# Patient Record
Sex: Female | Born: 1955 | Race: White | Hispanic: No | Marital: Single | Smoking: Current every day smoker
Health system: Southern US, Community
[De-identification: ages and names within clinical notes are randomized; demographics above are authoritative.]

## PROBLEM LIST (undated history)

## (undated) DIAGNOSIS — F329 Major depressive disorder, single episode, unspecified: Secondary | ICD-10-CM

## (undated) DIAGNOSIS — M549 Dorsalgia, unspecified: Secondary | ICD-10-CM

## (undated) DIAGNOSIS — F32A Depression, unspecified: Secondary | ICD-10-CM

## (undated) DIAGNOSIS — J45909 Unspecified asthma, uncomplicated: Secondary | ICD-10-CM

## (undated) DIAGNOSIS — J189 Pneumonia, unspecified organism: Secondary | ICD-10-CM

## (undated) DIAGNOSIS — M5136 Other intervertebral disc degeneration, lumbar region: Secondary | ICD-10-CM

## (undated) DIAGNOSIS — M51369 Other intervertebral disc degeneration, lumbar region without mention of lumbar back pain or lower extremity pain: Secondary | ICD-10-CM

## (undated) DIAGNOSIS — G8929 Other chronic pain: Secondary | ICD-10-CM

## (undated) DIAGNOSIS — I10 Essential (primary) hypertension: Secondary | ICD-10-CM

## (undated) DIAGNOSIS — F419 Anxiety disorder, unspecified: Secondary | ICD-10-CM

## (undated) DIAGNOSIS — J449 Chronic obstructive pulmonary disease, unspecified: Secondary | ICD-10-CM

## (undated) HISTORY — PX: BILATERAL SALPINGECTOMY: SHX5743

## (undated) HISTORY — PX: BACK SURGERY: SHX140

## (undated) HISTORY — PX: TONSILLECTOMY: SUR1361

## (undated) HISTORY — PX: PARTIAL HYSTERECTOMY: SHX80

## (undated) HISTORY — PX: DIAGNOSTIC LAPAROSCOPY: SUR761

## (undated) HISTORY — PX: RIGHT OOPHORECTOMY: SHX2359

## (undated) HISTORY — PX: CHOLECYSTECTOMY: SHX55

---

## 1998-05-10 ENCOUNTER — Encounter: Admission: RE | Admit: 1998-05-10 | Discharge: 1998-08-08 | Payer: Self-pay | Admitting: Anesthesiology

## 1998-08-13 ENCOUNTER — Encounter: Admission: RE | Admit: 1998-08-13 | Discharge: 1998-11-04 | Payer: Self-pay | Admitting: Anesthesiology

## 1998-11-04 ENCOUNTER — Encounter: Admission: RE | Admit: 1998-11-04 | Discharge: 1999-02-02 | Payer: Self-pay | Admitting: Anesthesiology

## 1999-02-04 ENCOUNTER — Encounter: Admission: RE | Admit: 1999-02-04 | Discharge: 1999-04-21 | Payer: Self-pay | Admitting: Anesthesiology

## 1999-04-21 ENCOUNTER — Encounter: Admission: RE | Admit: 1999-04-21 | Discharge: 1999-07-12 | Payer: Self-pay | Admitting: Anesthesiology

## 1999-07-12 ENCOUNTER — Encounter: Admission: RE | Admit: 1999-07-12 | Discharge: 1999-10-10 | Payer: Self-pay | Admitting: Anesthesiology

## 1999-08-11 ENCOUNTER — Encounter: Admission: RE | Admit: 1999-08-11 | Discharge: 1999-08-11 | Payer: Self-pay | Admitting: Family Medicine

## 1999-08-11 ENCOUNTER — Encounter: Payer: Self-pay | Admitting: Family Medicine

## 1999-10-26 ENCOUNTER — Encounter: Admission: RE | Admit: 1999-10-26 | Discharge: 1999-12-23 | Payer: Self-pay | Admitting: Anesthesiology

## 2000-03-26 ENCOUNTER — Encounter: Payer: Self-pay | Admitting: Family Medicine

## 2000-03-26 ENCOUNTER — Encounter: Admission: RE | Admit: 2000-03-26 | Discharge: 2000-03-26 | Payer: Self-pay | Admitting: Family Medicine

## 2001-05-02 ENCOUNTER — Other Ambulatory Visit: Admission: RE | Admit: 2001-05-02 | Discharge: 2001-05-02 | Payer: Self-pay | Admitting: Family Medicine

## 2003-02-18 ENCOUNTER — Encounter: Payer: Self-pay | Admitting: Family Medicine

## 2003-02-18 ENCOUNTER — Encounter: Admission: RE | Admit: 2003-02-18 | Discharge: 2003-02-18 | Payer: Self-pay | Admitting: Family Medicine

## 2003-03-12 ENCOUNTER — Encounter: Admission: RE | Admit: 2003-03-12 | Discharge: 2003-03-12 | Payer: Self-pay | Admitting: Family Medicine

## 2003-03-12 ENCOUNTER — Encounter: Payer: Self-pay | Admitting: Family Medicine

## 2005-03-09 ENCOUNTER — Encounter: Admission: RE | Admit: 2005-03-09 | Discharge: 2005-03-09 | Payer: Self-pay | Admitting: Family Medicine

## 2005-12-01 ENCOUNTER — Other Ambulatory Visit: Admission: RE | Admit: 2005-12-01 | Discharge: 2005-12-01 | Payer: Self-pay | Admitting: Family Medicine

## 2006-08-15 ENCOUNTER — Encounter: Admission: RE | Admit: 2006-08-15 | Discharge: 2006-08-15 | Payer: Self-pay | Admitting: Family Medicine

## 2008-01-30 ENCOUNTER — Other Ambulatory Visit: Admission: RE | Admit: 2008-01-30 | Discharge: 2008-01-30 | Payer: Self-pay | Admitting: Family Medicine

## 2009-04-29 ENCOUNTER — Encounter: Admission: RE | Admit: 2009-04-29 | Discharge: 2009-04-29 | Payer: Self-pay | Admitting: Obstetrics and Gynecology

## 2009-09-14 ENCOUNTER — Observation Stay (HOSPITAL_COMMUNITY): Admission: AD | Admit: 2009-09-14 | Discharge: 2009-09-16 | Payer: Self-pay | Admitting: Surgery

## 2009-09-14 ENCOUNTER — Encounter (INDEPENDENT_AMBULATORY_CARE_PROVIDER_SITE_OTHER): Payer: Self-pay | Admitting: Surgery

## 2011-01-03 LAB — DIFFERENTIAL
Basophils Relative: 1 % (ref 0–1)
Eosinophils Absolute: 0.4 10*3/uL (ref 0.0–0.7)
Monocytes Absolute: 1.2 10*3/uL — ABNORMAL HIGH (ref 0.1–1.0)
Monocytes Relative: 12 % (ref 3–12)
Neutro Abs: 5 10*3/uL (ref 1.7–7.7)

## 2011-01-03 LAB — COMPREHENSIVE METABOLIC PANEL
ALT: 83 U/L — ABNORMAL HIGH (ref 0–35)
Calcium: 8.7 mg/dL (ref 8.4–10.5)
Creatinine, Ser: 0.57 mg/dL (ref 0.4–1.2)
Glucose, Bld: 101 mg/dL — ABNORMAL HIGH (ref 70–99)
Sodium: 136 mEq/L (ref 135–145)
Total Protein: 6.3 g/dL (ref 6.0–8.3)

## 2011-01-03 LAB — CBC
Hemoglobin: 14.2 g/dL (ref 12.0–15.0)
Hemoglobin: 15.4 g/dL — ABNORMAL HIGH (ref 12.0–15.0)
Hemoglobin: 16 g/dL — ABNORMAL HIGH (ref 12.0–15.0)
MCHC: 33.5 g/dL (ref 30.0–36.0)
MCHC: 33.7 g/dL (ref 30.0–36.0)
MCHC: 34 g/dL (ref 30.0–36.0)
MCV: 102.7 fL — ABNORMAL HIGH (ref 78.0–100.0)
RBC: 4.12 MIL/uL (ref 3.87–5.11)
RBC: 4.66 MIL/uL (ref 3.87–5.11)
RDW: 12.8 % (ref 11.5–15.5)
WBC: 11.5 10*3/uL — ABNORMAL HIGH (ref 4.0–10.5)

## 2011-01-03 LAB — PROTIME-INR
INR: 1.05 (ref 0.00–1.49)
Prothrombin Time: 13.6 seconds (ref 11.6–15.2)

## 2011-01-03 LAB — BASIC METABOLIC PANEL
CO2: 30 mEq/L (ref 19–32)
Chloride: 98 mEq/L (ref 96–112)
GFR calc Af Amer: >60 mL/min (ref >60)
Sodium: 135 mEq/L (ref 135–145)

## 2011-01-03 LAB — HEPATIC FUNCTION PANEL
ALT: 85 U/L — ABNORMAL HIGH (ref 0–35)
AST: 81 U/L — ABNORMAL HIGH (ref 0–37)
Albumin: 3.3 g/dL — ABNORMAL LOW (ref 3.5–5.2)
Alkaline Phosphatase: 71 U/L (ref 39–117)
Total Bilirubin: 1.4 mg/dL — ABNORMAL HIGH (ref 0.3–1.2)

## 2011-10-05 ENCOUNTER — Other Ambulatory Visit (HOSPITAL_BASED_OUTPATIENT_CLINIC_OR_DEPARTMENT_OTHER): Payer: Self-pay | Admitting: Family Medicine

## 2011-10-05 DIAGNOSIS — R141 Gas pain: Secondary | ICD-10-CM

## 2011-10-06 ENCOUNTER — Other Ambulatory Visit (HOSPITAL_BASED_OUTPATIENT_CLINIC_OR_DEPARTMENT_OTHER): Payer: Self-pay

## 2013-04-16 ENCOUNTER — Other Ambulatory Visit: Payer: Self-pay | Admitting: Family Medicine

## 2013-04-16 DIAGNOSIS — Z1231 Encounter for screening mammogram for malignant neoplasm of breast: Secondary | ICD-10-CM

## 2013-05-15 ENCOUNTER — Ambulatory Visit
Admission: RE | Admit: 2013-05-15 | Discharge: 2013-05-15 | Disposition: A | Discharge status: Home / Self Care | Payer: BC Managed Care – PPO | Source: Ambulatory Visit | Attending: Family Medicine | Admitting: Family Medicine

## 2013-05-15 DIAGNOSIS — Z1231 Encounter for screening mammogram for malignant neoplasm of breast: Secondary | ICD-10-CM

## 2015-11-05 DIAGNOSIS — B370 Candidal stomatitis: Secondary | ICD-10-CM | POA: Diagnosis not present

## 2015-11-10 DIAGNOSIS — I10 Essential (primary) hypertension: Secondary | ICD-10-CM | POA: Diagnosis not present

## 2015-11-10 DIAGNOSIS — F3341 Major depressive disorder, recurrent, in partial remission: Secondary | ICD-10-CM | POA: Diagnosis not present

## 2015-11-10 DIAGNOSIS — N951 Menopausal and female climacteric states: Secondary | ICD-10-CM | POA: Diagnosis not present

## 2015-11-10 DIAGNOSIS — M545 Low back pain: Secondary | ICD-10-CM | POA: Diagnosis not present

## 2015-11-10 DIAGNOSIS — J45909 Unspecified asthma, uncomplicated: Secondary | ICD-10-CM | POA: Diagnosis not present

## 2015-12-02 ENCOUNTER — Telehealth: Payer: Self-pay | Admitting: *Deleted

## 2015-12-02 NOTE — Telephone Encounter (Signed)
lm informing the pt to call and schedule for an new pt appt.Marland KitchenMarland KitchenTD

## 2015-12-30 ENCOUNTER — Ambulatory Visit: Payer: PPO | Admitting: Pain Medicine

## 2016-01-13 DIAGNOSIS — M5136 Other intervertebral disc degeneration, lumbar region: Secondary | ICD-10-CM | POA: Diagnosis not present

## 2016-01-13 DIAGNOSIS — M5416 Radiculopathy, lumbar region: Secondary | ICD-10-CM | POA: Diagnosis not present

## 2016-01-13 DIAGNOSIS — M545 Low back pain: Secondary | ICD-10-CM | POA: Diagnosis not present

## 2016-01-13 DIAGNOSIS — M549 Dorsalgia, unspecified: Secondary | ICD-10-CM | POA: Diagnosis not present

## 2016-02-02 DIAGNOSIS — M25551 Pain in right hip: Secondary | ICD-10-CM | POA: Diagnosis not present

## 2016-02-02 DIAGNOSIS — G8929 Other chronic pain: Secondary | ICD-10-CM | POA: Diagnosis not present

## 2016-02-02 DIAGNOSIS — M4716 Other spondylosis with myelopathy, lumbar region: Secondary | ICD-10-CM | POA: Diagnosis not present

## 2016-02-02 DIAGNOSIS — Z79899 Other long term (current) drug therapy: Secondary | ICD-10-CM | POA: Diagnosis not present

## 2016-03-02 ENCOUNTER — Other Ambulatory Visit: Payer: Self-pay | Admitting: *Deleted

## 2016-03-02 ENCOUNTER — Ambulatory Visit
Admission: RE | Admit: 2016-03-02 | Discharge: 2016-03-02 | Disposition: A | Discharge status: Home / Self Care | Payer: PPO | Source: Ambulatory Visit | Attending: *Deleted | Admitting: *Deleted

## 2016-03-02 DIAGNOSIS — M4716 Other spondylosis with myelopathy, lumbar region: Secondary | ICD-10-CM

## 2016-03-02 DIAGNOSIS — M25552 Pain in left hip: Secondary | ICD-10-CM | POA: Diagnosis not present

## 2016-03-02 DIAGNOSIS — M25551 Pain in right hip: Secondary | ICD-10-CM | POA: Diagnosis not present

## 2016-03-09 DIAGNOSIS — M4716 Other spondylosis with myelopathy, lumbar region: Secondary | ICD-10-CM | POA: Diagnosis not present

## 2016-03-09 DIAGNOSIS — G8929 Other chronic pain: Secondary | ICD-10-CM | POA: Diagnosis not present

## 2016-03-09 DIAGNOSIS — E559 Vitamin D deficiency, unspecified: Secondary | ICD-10-CM | POA: Diagnosis not present

## 2016-03-09 DIAGNOSIS — M25551 Pain in right hip: Secondary | ICD-10-CM | POA: Diagnosis not present

## 2016-03-09 DIAGNOSIS — Z79891 Long term (current) use of opiate analgesic: Secondary | ICD-10-CM | POA: Diagnosis not present

## 2016-04-13 DIAGNOSIS — Z79891 Long term (current) use of opiate analgesic: Secondary | ICD-10-CM | POA: Diagnosis not present

## 2016-04-13 DIAGNOSIS — G8929 Other chronic pain: Secondary | ICD-10-CM | POA: Diagnosis not present

## 2016-04-13 DIAGNOSIS — M4716 Other spondylosis with myelopathy, lumbar region: Secondary | ICD-10-CM | POA: Diagnosis not present

## 2016-04-20 DIAGNOSIS — G8929 Other chronic pain: Secondary | ICD-10-CM | POA: Diagnosis not present

## 2016-04-20 DIAGNOSIS — M47816 Spondylosis without myelopathy or radiculopathy, lumbar region: Secondary | ICD-10-CM | POA: Diagnosis not present

## 2016-04-20 DIAGNOSIS — Z79891 Long term (current) use of opiate analgesic: Secondary | ICD-10-CM | POA: Diagnosis not present

## 2016-05-17 DIAGNOSIS — G2581 Restless legs syndrome: Secondary | ICD-10-CM | POA: Diagnosis not present

## 2016-05-17 DIAGNOSIS — N951 Menopausal and female climacteric states: Secondary | ICD-10-CM | POA: Diagnosis not present

## 2016-05-17 DIAGNOSIS — I10 Essential (primary) hypertension: Secondary | ICD-10-CM | POA: Diagnosis not present

## 2016-05-17 DIAGNOSIS — M545 Low back pain: Secondary | ICD-10-CM | POA: Diagnosis not present

## 2016-05-17 DIAGNOSIS — F3341 Major depressive disorder, recurrent, in partial remission: Secondary | ICD-10-CM | POA: Diagnosis not present

## 2016-05-17 DIAGNOSIS — J45909 Unspecified asthma, uncomplicated: Secondary | ICD-10-CM | POA: Diagnosis not present

## 2016-05-18 DIAGNOSIS — M4716 Other spondylosis with myelopathy, lumbar region: Secondary | ICD-10-CM | POA: Diagnosis not present

## 2016-05-18 DIAGNOSIS — G8929 Other chronic pain: Secondary | ICD-10-CM | POA: Diagnosis not present

## 2016-06-15 DIAGNOSIS — M47816 Spondylosis without myelopathy or radiculopathy, lumbar region: Secondary | ICD-10-CM | POA: Diagnosis not present

## 2016-06-15 DIAGNOSIS — Z79891 Long term (current) use of opiate analgesic: Secondary | ICD-10-CM | POA: Diagnosis not present

## 2016-06-15 DIAGNOSIS — M4716 Other spondylosis with myelopathy, lumbar region: Secondary | ICD-10-CM | POA: Diagnosis not present

## 2016-06-15 DIAGNOSIS — G8929 Other chronic pain: Secondary | ICD-10-CM | POA: Diagnosis not present

## 2016-06-19 DIAGNOSIS — H43313 Vitreous membranes and strands, bilateral: Secondary | ICD-10-CM | POA: Diagnosis not present

## 2016-06-19 DIAGNOSIS — H2513 Age-related nuclear cataract, bilateral: Secondary | ICD-10-CM | POA: Diagnosis not present

## 2016-06-22 DIAGNOSIS — G8929 Other chronic pain: Secondary | ICD-10-CM | POA: Diagnosis not present

## 2016-06-22 DIAGNOSIS — M47816 Spondylosis without myelopathy or radiculopathy, lumbar region: Secondary | ICD-10-CM | POA: Diagnosis not present

## 2016-07-06 DIAGNOSIS — M47816 Spondylosis without myelopathy or radiculopathy, lumbar region: Secondary | ICD-10-CM | POA: Diagnosis not present

## 2016-07-06 DIAGNOSIS — G8929 Other chronic pain: Secondary | ICD-10-CM | POA: Diagnosis not present

## 2016-07-06 DIAGNOSIS — Z79891 Long term (current) use of opiate analgesic: Secondary | ICD-10-CM | POA: Diagnosis not present

## 2016-07-17 ENCOUNTER — Other Ambulatory Visit: Payer: Self-pay | Admitting: Family Medicine

## 2016-07-26 ENCOUNTER — Other Ambulatory Visit: Payer: Self-pay | Admitting: Family Medicine

## 2016-07-26 DIAGNOSIS — M545 Low back pain, unspecified: Secondary | ICD-10-CM

## 2016-07-26 DIAGNOSIS — G8929 Other chronic pain: Secondary | ICD-10-CM

## 2016-07-28 ENCOUNTER — Ambulatory Visit
Admission: RE | Admit: 2016-07-28 | Discharge: 2016-07-28 | Disposition: A | Discharge status: Home / Self Care | Payer: PPO | Source: Ambulatory Visit | Attending: Family Medicine | Admitting: Family Medicine

## 2016-07-28 DIAGNOSIS — M5126 Other intervertebral disc displacement, lumbar region: Secondary | ICD-10-CM | POA: Diagnosis not present

## 2016-07-28 DIAGNOSIS — G8929 Other chronic pain: Secondary | ICD-10-CM

## 2016-07-28 DIAGNOSIS — M545 Low back pain, unspecified: Secondary | ICD-10-CM

## 2016-07-28 MED ORDER — GADOBENATE DIMEGLUMINE 529 MG/ML IV SOLN
11.0000 mL | Freq: Once | INTRAVENOUS | Status: AC | PRN
  Administered 2016-07-28: 11 mL via INTRAVENOUS

## 2016-08-03 DIAGNOSIS — M545 Low back pain: Secondary | ICD-10-CM | POA: Diagnosis not present

## 2016-08-14 DIAGNOSIS — Z79891 Long term (current) use of opiate analgesic: Secondary | ICD-10-CM | POA: Diagnosis not present

## 2016-08-14 DIAGNOSIS — M47816 Spondylosis without myelopathy or radiculopathy, lumbar region: Secondary | ICD-10-CM | POA: Diagnosis not present

## 2016-08-14 DIAGNOSIS — G8929 Other chronic pain: Secondary | ICD-10-CM | POA: Diagnosis not present

## 2016-09-13 DIAGNOSIS — M549 Dorsalgia, unspecified: Secondary | ICD-10-CM | POA: Diagnosis not present

## 2016-09-13 DIAGNOSIS — I10 Essential (primary) hypertension: Secondary | ICD-10-CM | POA: Diagnosis not present

## 2016-09-13 DIAGNOSIS — M5136 Other intervertebral disc degeneration, lumbar region: Secondary | ICD-10-CM | POA: Diagnosis not present

## 2016-09-19 DIAGNOSIS — G8929 Other chronic pain: Secondary | ICD-10-CM | POA: Diagnosis not present

## 2016-09-19 DIAGNOSIS — M545 Low back pain: Secondary | ICD-10-CM | POA: Diagnosis not present

## 2016-09-21 DIAGNOSIS — Z124 Encounter for screening for malignant neoplasm of cervix: Secondary | ICD-10-CM | POA: Diagnosis not present

## 2016-09-21 DIAGNOSIS — N939 Abnormal uterine and vaginal bleeding, unspecified: Secondary | ICD-10-CM | POA: Diagnosis not present

## 2016-09-21 DIAGNOSIS — R102 Pelvic and perineal pain: Secondary | ICD-10-CM | POA: Diagnosis not present

## 2016-10-23 DIAGNOSIS — M545 Low back pain: Secondary | ICD-10-CM | POA: Diagnosis not present

## 2016-10-23 DIAGNOSIS — G8929 Other chronic pain: Secondary | ICD-10-CM | POA: Diagnosis not present

## 2016-10-23 DIAGNOSIS — Z79899 Other long term (current) drug therapy: Secondary | ICD-10-CM | POA: Diagnosis not present

## 2016-10-25 ENCOUNTER — Other Ambulatory Visit: Payer: Self-pay | Admitting: Obstetrics and Gynecology

## 2016-10-25 DIAGNOSIS — N8501 Benign endometrial hyperplasia: Secondary | ICD-10-CM | POA: Diagnosis not present

## 2016-10-25 DIAGNOSIS — N950 Postmenopausal bleeding: Secondary | ICD-10-CM | POA: Diagnosis not present

## 2016-11-14 DIAGNOSIS — N950 Postmenopausal bleeding: Secondary | ICD-10-CM | POA: Diagnosis not present

## 2016-11-21 DIAGNOSIS — M545 Low back pain: Secondary | ICD-10-CM | POA: Diagnosis not present

## 2016-11-21 DIAGNOSIS — G8929 Other chronic pain: Secondary | ICD-10-CM | POA: Diagnosis not present

## 2016-11-27 NOTE — Patient Instructions (Signed)
Your procedure is scheduled on:  Tomorrow, Feb. 28, 2018  [facility] at:  12:30PM  Pick up the phone at the desk and dial [phone].  Call this number if you have problems the morning of surgery: [phone].  Remember: Do NOT eat food:  After 6:00 AM day of surgery  Do NOT drink clear liquids after:  10:00 AM day of surgery  Take these medicines the morning of surgery with a SIP OF WATER:  Bisoprolol, Duloxetine, Use Asthma Inhalers per normal routine  Stop ALL herbal medications at this time  Do NOT smoke the day of surgery.  Do NOT wear jewelry (body piercing), metal hair clips/bobby pins, make-up, or nail polish. Do NOT wear lotions, powders, or perfumes.  You may wear deodorant. Do NOT shave for 48 hours prior to surgery. Do NOT bring valuables to the hospital. Contacts, dentures, or bridgework may not be worn into surgery.  Have a responsible adult drive you home and stay with you for 24 hours after your procedure  Bring a copy of your healthcare power of attorney and living will documents.  **Effective Friday, Jan. 12, 2018, [place] will implement no hospital visitations from children age 61 and younger due to a steady increase in flu activity in our community and hospitals. **

## 2016-11-28 ENCOUNTER — Encounter (HOSPITAL_COMMUNITY): Payer: Self-pay

## 2016-11-28 ENCOUNTER — Other Ambulatory Visit: Payer: Self-pay

## 2016-11-28 ENCOUNTER — Encounter (HOSPITAL_COMMUNITY)
Admission: RE | Admit: 2016-11-28 | Discharge: 2016-11-28 | Disposition: A | Discharge status: Home / Self Care | Payer: PPO | Source: Ambulatory Visit | Attending: Obstetrics and Gynecology | Admitting: Obstetrics and Gynecology

## 2016-11-28 DIAGNOSIS — F17210 Nicotine dependence, cigarettes, uncomplicated: Secondary | ICD-10-CM | POA: Diagnosis not present

## 2016-11-28 DIAGNOSIS — N8501 Benign endometrial hyperplasia: Secondary | ICD-10-CM | POA: Diagnosis not present

## 2016-11-28 DIAGNOSIS — Z79899 Other long term (current) drug therapy: Secondary | ICD-10-CM | POA: Diagnosis not present

## 2016-11-28 DIAGNOSIS — J449 Chronic obstructive pulmonary disease, unspecified: Secondary | ICD-10-CM | POA: Diagnosis not present

## 2016-11-28 DIAGNOSIS — F329 Major depressive disorder, single episode, unspecified: Secondary | ICD-10-CM | POA: Diagnosis not present

## 2016-11-28 DIAGNOSIS — N950 Postmenopausal bleeding: Secondary | ICD-10-CM | POA: Diagnosis not present

## 2016-11-28 DIAGNOSIS — F419 Anxiety disorder, unspecified: Secondary | ICD-10-CM | POA: Diagnosis not present

## 2016-11-28 DIAGNOSIS — Z79891 Long term (current) use of opiate analgesic: Secondary | ICD-10-CM | POA: Diagnosis not present

## 2016-11-28 DIAGNOSIS — I10 Essential (primary) hypertension: Secondary | ICD-10-CM | POA: Diagnosis not present

## 2016-11-28 DIAGNOSIS — Z7951 Long term (current) use of inhaled steroids: Secondary | ICD-10-CM | POA: Diagnosis not present

## 2016-11-28 DIAGNOSIS — Z8049 Family history of malignant neoplasm of other genital organs: Secondary | ICD-10-CM | POA: Diagnosis not present

## 2016-11-28 HISTORY — DX: Other intervertebral disc degeneration, lumbar region without mention of lumbar back pain or lower extremity pain: M51.369

## 2016-11-28 HISTORY — DX: Depression, unspecified: F32.A

## 2016-11-28 HISTORY — DX: Major depressive disorder, single episode, unspecified: F32.9

## 2016-11-28 HISTORY — DX: Other intervertebral disc degeneration, lumbar region: M51.36

## 2016-11-28 HISTORY — DX: Anxiety disorder, unspecified: F41.9

## 2016-11-28 HISTORY — DX: Essential (primary) hypertension: I10

## 2016-11-28 HISTORY — DX: Dorsalgia, unspecified: M54.9

## 2016-11-28 HISTORY — DX: Other chronic pain: G89.29

## 2016-11-28 HISTORY — DX: Unspecified asthma, uncomplicated: J45.909

## 2016-11-28 HISTORY — DX: Chronic obstructive pulmonary disease, unspecified: J44.9

## 2016-11-28 LAB — BASIC METABOLIC PANEL
ANION GAP: 10 (ref 5–15)
BUN: 11 mg/dL (ref 6–20)
CALCIUM: 9.2 mg/dL (ref 8.9–10.3)
CHLORIDE: 98 mmol/L — AB (ref 101–111)
CO2: 26 mmol/L (ref 22–32)
Creatinine, Ser: 0.6 mg/dL (ref 0.44–1.00)
GFR calc Af Amer: >60 mL/min (ref >60)
GFR calc non Af Amer: >60 mL/min (ref >60)
GLUCOSE: 95 mg/dL (ref 65–99)
POTASSIUM: 4.1 mmol/L (ref 3.5–5.1)
Sodium: 134 mmol/L — ABNORMAL LOW (ref 135–145)

## 2016-11-28 LAB — CBC
HEMATOCRIT: 43.5 % (ref 36.0–46.0)
HEMOGLOBIN: 15.7 g/dL — AB (ref 12.0–15.0)
MCH: 36.4 pg — ABNORMAL HIGH (ref 26.0–34.0)
MCHC: 36.1 g/dL — ABNORMAL HIGH (ref 30.0–36.0)
MCV: 100.9 fL — AB (ref 78.0–100.0)
Platelets: 307 10*3/uL (ref 150–400)
RBC: 4.31 MIL/uL (ref 3.87–5.11)
RDW: 12.5 % (ref 11.5–15.5)
WBC: 11.8 10*3/uL — AB (ref 4.0–10.5)

## 2016-11-29 ENCOUNTER — Ambulatory Visit (HOSPITAL_COMMUNITY): Payer: PPO | Admitting: Certified Registered Nurse Anesthetist

## 2016-11-29 ENCOUNTER — Encounter (HOSPITAL_COMMUNITY)
Admission: RE | Disposition: A | Discharge status: Home / Self Care | Payer: Self-pay | Source: Ambulatory Visit | Attending: Obstetrics and Gynecology

## 2016-11-29 ENCOUNTER — Ambulatory Visit (HOSPITAL_COMMUNITY)
Admission: RE | Admit: 2016-11-29 | Discharge: 2016-11-29 | Disposition: A | Discharge status: Home / Self Care | Payer: PPO | Source: Ambulatory Visit | Attending: Obstetrics and Gynecology | Admitting: Obstetrics and Gynecology

## 2016-11-29 ENCOUNTER — Encounter (HOSPITAL_COMMUNITY): Payer: Self-pay | Admitting: *Deleted

## 2016-11-29 ENCOUNTER — Ambulatory Visit (HOSPITAL_COMMUNITY): Payer: PPO

## 2016-11-29 DIAGNOSIS — Z79891 Long term (current) use of opiate analgesic: Secondary | ICD-10-CM | POA: Diagnosis not present

## 2016-11-29 DIAGNOSIS — Z8049 Family history of malignant neoplasm of other genital organs: Secondary | ICD-10-CM | POA: Insufficient documentation

## 2016-11-29 DIAGNOSIS — J45909 Unspecified asthma, uncomplicated: Secondary | ICD-10-CM | POA: Diagnosis not present

## 2016-11-29 DIAGNOSIS — Z7951 Long term (current) use of inhaled steroids: Secondary | ICD-10-CM | POA: Diagnosis not present

## 2016-11-29 DIAGNOSIS — N8500 Endometrial hyperplasia, unspecified: Secondary | ICD-10-CM | POA: Diagnosis not present

## 2016-11-29 DIAGNOSIS — F17210 Nicotine dependence, cigarettes, uncomplicated: Secondary | ICD-10-CM | POA: Insufficient documentation

## 2016-11-29 DIAGNOSIS — N8501 Benign endometrial hyperplasia: Secondary | ICD-10-CM | POA: Diagnosis not present

## 2016-11-29 DIAGNOSIS — N950 Postmenopausal bleeding: Secondary | ICD-10-CM | POA: Diagnosis not present

## 2016-11-29 DIAGNOSIS — I10 Essential (primary) hypertension: Secondary | ICD-10-CM | POA: Insufficient documentation

## 2016-11-29 DIAGNOSIS — F419 Anxiety disorder, unspecified: Secondary | ICD-10-CM | POA: Diagnosis not present

## 2016-11-29 DIAGNOSIS — F418 Other specified anxiety disorders: Secondary | ICD-10-CM | POA: Diagnosis not present

## 2016-11-29 DIAGNOSIS — F329 Major depressive disorder, single episode, unspecified: Secondary | ICD-10-CM | POA: Diagnosis not present

## 2016-11-29 DIAGNOSIS — Z79899 Other long term (current) drug therapy: Secondary | ICD-10-CM | POA: Diagnosis not present

## 2016-11-29 DIAGNOSIS — J449 Chronic obstructive pulmonary disease, unspecified: Secondary | ICD-10-CM | POA: Diagnosis not present

## 2016-11-29 DIAGNOSIS — R58 Hemorrhage, not elsewhere classified: Secondary | ICD-10-CM

## 2016-11-29 HISTORY — PX: DILATATION & CURETTAGE/HYSTEROSCOPY WITH MYOSURE: SHX6511

## 2016-11-29 SURGERY — DILATATION & CURETTAGE/HYSTEROSCOPY WITH MYOSURE
Anesthesia: General | Site: Uterus

## 2016-11-29 MED ORDER — PROPOFOL 10 MG/ML IV BOLUS
INTRAVENOUS | Status: AC
  Filled 2016-11-29: qty 20

## 2016-11-29 MED ORDER — LIDOCAINE HCL (CARDIAC) 20 MG/ML IV SOLN
INTRAVENOUS | Status: DC | PRN
  Administered 2016-11-29: 60 mg via INTRAVENOUS

## 2016-11-29 MED ORDER — SCOPOLAMINE 1 MG/3DAYS TD PT72
1.0000 | MEDICATED_PATCH | Freq: Once | TRANSDERMAL | Status: DC
  Administered 2016-11-29: 1.5 mg via TRANSDERMAL

## 2016-11-29 MED ORDER — PROPOFOL 10 MG/ML IV BOLUS
INTRAVENOUS | Status: DC | PRN
  Administered 2016-11-29: 120 mg via INTRAVENOUS

## 2016-11-29 MED ORDER — ONDANSETRON HCL 4 MG/2ML IJ SOLN
INTRAMUSCULAR | Status: AC
  Filled 2016-11-29: qty 2

## 2016-11-29 MED ORDER — DEXAMETHASONE SODIUM PHOSPHATE 4 MG/ML IJ SOLN
INTRAMUSCULAR | Status: AC
  Filled 2016-11-29: qty 1

## 2016-11-29 MED ORDER — PROMETHAZINE HCL 25 MG/ML IJ SOLN: 6.2500 mg | INTRAMUSCULAR | Status: DC | PRN

## 2016-11-29 MED ORDER — KETOROLAC TROMETHAMINE 30 MG/ML IJ SOLN
INTRAMUSCULAR | Status: AC
  Filled 2016-11-29: qty 1

## 2016-11-29 MED ORDER — LIDOCAINE HCL 2 % IJ SOLN
INTRAMUSCULAR | Status: AC
  Filled 2016-11-29: qty 20

## 2016-11-29 MED ORDER — MIDAZOLAM HCL 2 MG/2ML IJ SOLN
INTRAMUSCULAR | Status: AC
  Filled 2016-11-29: qty 2

## 2016-11-29 MED ORDER — LACTATED RINGERS IV SOLN
INTRAVENOUS | Status: DC
  Administered 2016-11-29 (×2): via INTRAVENOUS

## 2016-11-29 MED ORDER — FENTANYL CITRATE (PF) 100 MCG/2ML IJ SOLN
INTRAMUSCULAR | Status: DC | PRN
  Administered 2016-11-29 (×2): 50 ug via INTRAVENOUS

## 2016-11-29 MED ORDER — FENTANYL CITRATE (PF) 100 MCG/2ML IJ SOLN
INTRAMUSCULAR | Status: AC
  Filled 2016-11-29: qty 2

## 2016-11-29 MED ORDER — SODIUM CHLORIDE 0.9 % IR SOLN
Status: DC | PRN
  Administered 2016-11-29: 3000 mL

## 2016-11-29 MED ORDER — DEXAMETHASONE SODIUM PHOSPHATE 4 MG/ML IJ SOLN
INTRAMUSCULAR | Status: DC | PRN
  Administered 2016-11-29: 4 mg via INTRAVENOUS

## 2016-11-29 MED ORDER — SCOPOLAMINE 1 MG/3DAYS TD PT72
MEDICATED_PATCH | TRANSDERMAL | Status: AC
  Administered 2016-11-29: 1.5 mg via TRANSDERMAL
  Filled 2016-11-29: qty 1

## 2016-11-29 MED ORDER — MIDAZOLAM HCL 2 MG/2ML IJ SOLN
INTRAMUSCULAR | Status: DC | PRN
  Administered 2016-11-29: 2 mg via INTRAVENOUS

## 2016-11-29 MED ORDER — ONDANSETRON HCL 4 MG/2ML IJ SOLN
INTRAMUSCULAR | Status: DC | PRN
  Administered 2016-11-29: 4 mg via INTRAVENOUS

## 2016-11-29 MED ORDER — FENTANYL CITRATE (PF) 100 MCG/2ML IJ SOLN
25.0000 ug | INTRAMUSCULAR | Status: DC | PRN
  Administered 2016-11-29: 50 ug via INTRAVENOUS

## 2016-11-29 MED ORDER — LIDOCAINE HCL (CARDIAC) 20 MG/ML IV SOLN
INTRAVENOUS | Status: AC
  Filled 2016-11-29: qty 5

## 2016-11-29 MED ORDER — LIDOCAINE HCL 2 % IJ SOLN
INTRAMUSCULAR | Status: DC | PRN
  Administered 2016-11-29: 10 mL

## 2016-11-29 SURGICAL SUPPLY — 19 items
CANISTER SUCT 3000ML (MISCELLANEOUS) ×2 IMPLANT
CATH ROBINSON RED A/P 16FR (CATHETERS) ×2 IMPLANT
CLOTH BEACON ORANGE TIMEOUT ST (SAFETY) ×2 IMPLANT
CONTAINER PREFILL 10% NBF 60ML (FORM) ×4 IMPLANT
DEVICE MYOSURE LITE (MISCELLANEOUS) IMPLANT
DEVICE MYOSURE REACH (MISCELLANEOUS) IMPLANT
DILATOR CANAL MILEX (MISCELLANEOUS) ×2 IMPLANT
FILTER ARTHROSCOPY CONVERTOR (FILTER) ×2 IMPLANT
GLOVE BIO SURGEON STRL SZ7 (GLOVE) ×2 IMPLANT
GLOVE BIOGEL PI IND STRL 7.0 (GLOVE) ×2 IMPLANT
GLOVE BIOGEL PI INDICATOR 7.0 (GLOVE) ×2
GOWN STRL REUS W/TWL LRG LVL3 (GOWN DISPOSABLE) ×4 IMPLANT
PACK VAGINAL MINOR WOMEN LF (CUSTOM PROCEDURE TRAY) ×2 IMPLANT
PAD OB MATERNITY 4.3X12.25 (PERSONAL CARE ITEMS) ×2 IMPLANT
SEAL ROD LENS SCOPE MYOSURE (ABLATOR) ×2 IMPLANT
TOWEL OR 17X24 6PK STRL BLUE (TOWEL DISPOSABLE) ×4 IMPLANT
TUBING AQUILEX INFLOW (TUBING) ×2 IMPLANT
TUBING AQUILEX OUTFLOW (TUBING) ×2 IMPLANT
WATER STERILE IRR 1000ML POUR (IV SOLUTION) ×2 IMPLANT

## 2016-11-29 NOTE — Transfer of Care (Signed)
Immediate Anesthesia Transfer of Care Note  Patient: Renee Oliver  Procedure(s) Performed: Procedure(s) with comments: DILATATION & CURETTAGE/HYSTEROSCOPY (N/A) - Ultrasound Guidance  Patient Location: PACU  Anesthesia Type:General  Level of Consciousness: awake, alert , oriented and patient cooperative  Airway & Oxygen Therapy: Patient Spontanous Breathing and Patient connected to nasal cannula oxygen  Post-op Assessment: Report given to RN and Post -op Vital signs reviewed and stable  Post vital signs: Reviewed and stable 147/74, 92 NSR, 97% on 1L [place]  Last Vitals:  Vitals:   11/29/16 1243  BP: 130/70  Pulse: 74  Resp: 18  Temp: 36.5 C    Last Pain:  Vitals:   11/29/16 1243  TempSrc: Oral  PainSc: 3       Patients Stated Pain Goal: 3 (11/29/16 1243)  Complications: No apparent anesthesia complications

## 2016-11-29 NOTE — Brief Op Note (Signed)
11/29/2016  2:23 PM  PATIENT:  Reece PackerVicki L Gibbard  61 y.o. 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, Complex endometrial hyperplasia  POST-OPERATIVE DIAGNOSIS:  Same  PROCEDURE:  Procedure(s) with comments: DILATATION & CURETTAGE/HYSTEROSCOPY (N/A) - Ultrasound Guidance  SURGEON:  Surgeon(s) and Role:    * Geryl RankinsEvelyn Marigrace Mccole, MD - Primary  PHYSICIAN ASSISTANT:   ASSISTANTS: none   ANESTHESIA:   local and general  EBL:  Total I/O In: 1000 [I.V.:1000] Out: 115 [Urine:110; Blood:5]  Fluid deficit 130 ml  BLOOD ADMINISTERED:none  DRAINS: none   LOCAL MEDICATIONS USED:  2% LIDOCAINE  and Amount: 10 ml  SPECIMEN:  Source of Specimen:  Endometrial currettings, endocervical currettings  DISPOSITION OF SPECIMEN:  PATHOLOGY  COUNTS:  YES  TOURNIQUET:  * No tourniquets in log *  DICTATION: .  Dictation number [id number]  PLAN OF CARE: Discharge to home after PACU  PATIENT DISPOSITION:  PACU - hemodynamically stable.   Delay start of Pharmacological VTE agent (>24hrs) due to surgical blood loss or risk of bleeding: yes

## 2016-11-29 NOTE — Discharge Instructions (Signed)
Hysteroscopy, Care After Refer to this sheet in the next few weeks. These instructions provide you with information on caring for yourself after your procedure. Your health care provider may also give you more specific instructions. Your treatment has been planned according to current medical practices, but problems sometimes occur. Call your health care provider if you have any problems or questions after your procedure. What can I expect after the procedure? After your procedure, it is typical to have the following:  You may have some cramping. This normally lasts for a couple days.  You may have bleeding. This can vary from light spotting for a few days to menstrual-like bleeding for 3-7 days. Follow these instructions at home:  Rest for the first 1-2 days after the procedure.  Only take over-the-counter or prescription medicines as directed by your health care provider. Do not take aspirin. It can increase the chances of bleeding.  Take showers instead of baths for 2 weeks or as directed by your health care provider.  Do not drive for 24 hours or as directed.  Do not drink alcohol while taking pain medicine.  Do not use tampons, douche, or have sexual intercourse for 2 weeks or until your health care provider says it is okay.  Take your temperature twice a day for 4-5 days. Write it down each time.  Follow your health care provider's advice about diet, exercise, and lifting.  If you develop constipation, you may:  Take a mild laxative if your health care provider approves.  Add bran foods to your diet.  Drink enough fluids to keep your urine clear or pale yellow.  Try to have someone with you or available to you for the first 24-48 hours, especially if you were given a general anesthetic.  Follow up with your health care provider as directed. Contact a health care provider if:  You feel dizzy or lightheaded.  You feel sick to your stomach (nauseous).  You have abnormal  vaginal discharge.  You have a rash.  You have pain that is not controlled with medicine. Get help right away if:  You have bleeding that is heavier than a normal menstrual period.  You have a fever.  You have increasing cramps or pain, not controlled with medicine.  You have new belly (abdominal) pain.  You pass out.  You have pain in the tops of your shoulders (shoulder strap areas).  You have shortness of breath. This information is not intended to replace advice given to you by your health care provider. Make sure you discuss any questions you have with your health care provider. Document Released: 07/09/2013 Document Revised: 02/24/2016 Document Reviewed: 04/17/2013 Elsevier Interactive Patient Education  2017 [employer]   Dilation and Curettage or Vacuum Curettage, Care After This sheet gives you information about how to care for yourself after your procedure. Your health care provider may also give you more specific instructions. If you have problems or questions, contact your health care provider. What can I expect after the procedure? After your procedure, it is common to have:  Mild pain or cramping.  Some vaginal bleeding or spotting. These may last for up to 2 weeks after your procedure. Follow these instructions at home: Activity    Do not drive or use heavy machinery while taking prescription pain medicine.  Avoid driving for the first 24 hours after your procedure.  Take frequent, short walks, followed by rest periods, throughout the day. Ask your health care provider what activities are safe  for you. After 1-2 days, you may be able to return to your normal activities.  Do not lift anything heavier than 10 lb (4.5 kg) until your health care provider approves.  For at least 2 weeks, or as long as told by your health care provider, do not:  Douche.  Use tampons.  Have sexual intercourse. General instructions    Take over-the-counter and  prescription medicines only as told by your health care provider. This is especially important if you take blood thinning medicine.  Do not take baths, swim, or use a hot tub until your health care provider approves. Take showers instead of baths.  Wear compression stockings as told by your health care provider. These stockings help to prevent blood clots and reduce swelling in your legs.  It is your responsibility to get the results of your procedure. Ask your health care provider, or the department performing the procedure, when your results will be ready.  Keep all follow-up visits as told by your health care provider. This is important. Contact a health care provider if:  You have severe cramps that get worse or that do not get better with medicine.  You have severe abdominal pain.  You cannot drink fluids without vomiting.  You develop pain in a different area of your pelvis.  You have bad-smelling vaginal discharge.  You have a rash. Get help right away if:  You have vaginal bleeding that soaks more than one sanitary pad in 1 hour, for 2 hours in a row.  You pass large blood clots from your vagina.  You have a fever that is above 100.79F (38.0C).  Your abdomen feels very tender or hard.  You have chest pain.  You have shortness of breath.  You cough up blood.  You feel dizzy or light-headed.  You faint.  You have pain in your neck or shoulder area. This information is not intended to replace advice given to you by your health care provider. Make sure you discuss any questions you have with your health care provider. Document Released: 09/15/2000 Document Revised: 05/17/2016 Document Reviewed: 04/20/2016 Elsevier Interactive Patient Education  2017 [employer].

## 2016-11-29 NOTE — Anesthesia Procedure Notes (Signed)
Procedure Name: LMA Insertion Date/Time: 11/29/2016 1:49 PM Performed by: Yolonda KidaARVER, Benson Porcaro L Pre-anesthesia Checklist: Patient identified, Emergency Drugs available, Suction available and Patient being monitored Patient Re-evaluated:Patient Re-evaluated prior to inductionOxygen Delivery Method: Circle system utilized Preoxygenation: Pre-oxygenation with 100% oxygen Intubation Type: IV induction Ventilation: Mask ventilation without difficulty LMA: LMA inserted LMA Size: 4.0 Number of attempts: 1 Placement Confirmation: positive ETCO2,  CO2 detector and breath sounds checked- equal and bilateral Tube secured with: Tape Dental Injury: Teeth and Oropharynx as per pre-operative assessment

## 2016-11-29 NOTE — Anesthesia Preprocedure Evaluation (Signed)
Anesthesia Evaluation  Patient identified by MRN, date of birth, ID band Patient awake    Reviewed: Allergy & Precautions, NPO status , Patient's Chart, lab work & pertinent test results  Airway Mallampati: II  TM Distance: >3 FB Neck ROM: Full    Dental  (+) Dental Advisory Given   Pulmonary asthma , COPD,  COPD inhaler, Current Smoker,    breath sounds clear to auscultation       Cardiovascular hypertension, Pt. on medications  Rhythm:Regular Rate:Normal     Neuro/Psych Anxiety Depression negative neurological ROS     GI/Hepatic negative GI ROS, Neg liver ROS,   Endo/Other  negative endocrine ROS  Renal/GU negative Renal ROS     Musculoskeletal  (+) Arthritis ,   Abdominal   Peds  Hematology negative hematology ROS (+)   Anesthesia Other Findings   Reproductive/Obstetrics                             Lab Results  Component Value Date   WBC 11.8 (H) 11/28/2016   HGB 15.7 (H) 11/28/2016   HCT 43.5 11/28/2016   MCV 100.9 (H) 11/28/2016   PLT 307 11/28/2016    Anesthesia Physical Anesthesia Plan  ASA: II  Anesthesia Plan: General   Post-op Pain Management:    Induction: Intravenous  Airway Management Planned: LMA  Additional Equipment:   Intra-op Plan:   Post-operative Plan: Extubation in OR  Informed Consent: I have reviewed the patients History and Physical, chart, labs and discussed the procedure including the risks, benefits and alternatives for the proposed anesthesia with the patient or authorized representative who has indicated his/her understanding and acceptance.   Dental advisory given  Plan Discussed with: CRNA  Anesthesia Plan Comments:         Anesthesia Quick Evaluation

## 2016-11-29 NOTE — Anesthesia Postprocedure Evaluation (Signed)
Anesthesia Post Note  Patient: Reece PackerVicki L Resch  Procedure(s) Performed: Procedure(s) (LRB): DILATATION & CURETTAGE/HYSTEROSCOPY (N/A)  Patient location during evaluation: PACU Anesthesia Type: General Level of consciousness: awake and alert Pain management: pain level controlled Vital Signs Assessment: post-procedure vital signs reviewed and stable Respiratory status: spontaneous breathing, nonlabored ventilation, respiratory function stable and patient connected to nasal cannula oxygen Cardiovascular status: blood pressure returned to baseline and stable Postop Assessment: no signs of nausea or vomiting Anesthetic complications: no        Last Vitals:  Vitals:   11/29/16 1515 11/29/16 1530  BP: 127/77 106/74  Pulse: 81 80  Resp: 16 16  Temp:  36.7 C    Last Pain:  Vitals:   11/29/16 1530  TempSrc:   PainSc: 3    Pain Goal: Patients Stated Pain Goal: 3 (11/29/16 1530)               Kennieth RadFitzgerald, Kavina Cantave E

## 2016-11-29 NOTE — Interval H&P Note (Signed)
History and Physical Interval Note:  11/29/2016 1:14 PM  Renee Oliver  has presented today for surgery, with the diagnosis of N95.0 PMB  The various methods of treatment have been discussed with the patient and family. After consideration of risks, benefits and other options for treatment, the patient has consented to  Procedure(s) with comments: DILATATION & CURETTAGE/HYSTEROSCOPY WITH MYOSURE (N/A) - Ultrasound Guidance as a surgical intervention .  The patient's history has been reviewed, patient examined, no change in status, stable for surgery.  I have reviewed the patient's chart and labs.  Questions were answered to the patient's satisfaction.    Pt has had intermittent bleeding since last visit.  Denies chest pain, SOB, dizziness.  Dion BodyVARNADO, Arben Packman

## 2016-11-29 NOTE — H&P (Signed)
History of Present Illness  General:  61 y/o with c/o postmenopausal bleeding. Spotting since December. Sometimes it is heavier but can range from brown to red. Has had bad cramping, which seems to make bleeding worse. Has used up to 4 pantyliners/day. Denies dysparenia. Has been with same parter x 12 years. Takes English as a second language teacherstraiol and Prometrium daily. Has not missed doses. Still having 5 -6 /day and night sweats.   Current Medications  Taking   No OTC meds   OxyContin(OxyCODONE HCl ER) 30 MG Tablet ER 12 Hour Abuse-Deterrent 1 tablet Orally Twice a day   Supplement: Generic imodium   Percocet(Oxycodone-Acetaminophen) 10-325 MG Tablet Orally 3 pills a day   Cymbalta(DULoxetine HCl) 60 MG Capsule Delayed Release Particles 2 capsules Orally Once  a day   Bisoprolol-Hydrochlorothiazide 10-6.25 MG Tablet 1 tablet Orally Once a day   Symbicort(Budesonide-Formoterol Fumarate) 160-4.5 MCG/ACT Aerosol 2 puffs Inhalation Twice a day   Progesterone Micronized 100 Capsule 1 capsule Orally Once a day   Pramipexole Dihydrochloride 0.25 MG Tablet 1 tablet before bedtime Orally Once a day   Estradiol 2 MG Tablet 1 tablet Orally Once a day   Not-Taking/PRN   Gabapentin 300 MG Capsule 1 capsule before bedtime Orally Once a day   Medication List reviewed and reconciled with the patient    Past Medical History  Depression.   Hypertension.   Menopausal symptoms.   COPD/asthma - smoker.   Degenerative disc disease ([facility]).    Surgical History  lumbar laminectomy 03/90  bilateral salpingectomy and right oophorectomy 1994  tonsillectomy 1971  laparoscopy for pelvic pain    Family History  Father: deceased  Mother: deceased, hypertension, diabetes, congestive heart failure, diagnosed with Diabetes, Hypertension, CHF (congestive heart failure)  Paternal Grand Father: deceased  Paternal Grand Mother: deceased  Maternal Grand Father: deceased  Maternal Grand Mother: deceased   Brother 1: alive, diagnosed with Hypertension  Sister 1: alive, diagnosed with Hypertension  Sister 2: alive, diagnosed with Hypertension  Paternal aunt: gynecologic cancer, and one aunt with lung cancer  1 brother(s) , 2 sister(s) .   Updated 09-18-13.   Social History  General:  Tobacco use  cigarettes: Current smoker Frequency: 1/2 PPD Tobacco history last updated 10/25/2016 Alcohol: ocassionally.  Caffeine: yes, 2 servings daily, coffee.  no Recreational drug use.  Exercise: walks.  Marital Status: single, committed relationship.  Children: none.  OCCUPATION: unemployed, Disability as of 05/2013.  Seat belt use: no.    Gyn History  Sexual activity currently sexually active.  Periods : postmenopausal.  LMP bleeding x 4 weeks off and on.  Denies H/O Birth control.  Last pap smear date 09/2016, all negative.  Last mammogram date within the last two years.  Denies H/O Abnormal pap smear.  Denies H/O STD.    OB History  Never been pregnant per patient.    Allergies  Prednisone: crazy feeling: Side Effects  Amoxicillin: stomach upset: Side Effects  Aspir-81: heart race: Side Effects  NSAID: stomach upset: Side Effects  MiraLax: nausea: Side Effects  Nystatin: talk crazy "out of her head": Side Effects   Hospitalization/Major Diagnostic Procedure  Not in last year 10/2016   Physical Examination  GENERAL:  Patient appears alert and oriented.  General Appearance: well-appearing, well-developed, no acute distress.  Speech: clear.  FEMALE GENITOURINARY:  Cervix visualized, healthy appearing, no discharge, no lesions.  Vagina: pink/moist mucosa, no lesions, no abnormal discharge.  Vulva: normal, no lesions, no skin discoloration.     Assessments  1. PMB (postmenopausal bleeding) - N95.0 (Primary)   Treatment  1. PMB (postmenopausal bleeding)  PROCEDURE: GYN ENDOMETRIAL BIOPSY   Procedures  Endometrial Biopsy:  Consent Informed consent obtained. UPT  negative.  Prep cervix was prepped with betadine.  Procedure uterus was sounded to 5-6 cm. a 4 mm pipet was advanced without difficulty, with scant return of tissue, 2 passes.  Post procedure Patient tolerated procedure well.  Indication abnormal uterine bleeding.    Addendum-Ultrasound EMS 2 mm, uterine length 6 cm.  No masses seen.                    EMB complex hyperplasia.

## 2016-11-30 NOTE — Op Note (Signed)
NAMLeanora Oliver:  [name], [name]                ACCOUNT NO.:  [id number]  MEDICAL RECORD NO.:  [id number]  LOCATION:                                 FACILITY:  PHYSICIAN:  Pieter PartridgeEvelyn B Irlene Crudup, MD   DATE OF BIRTH:  12/09/1955  DATE OF PROCEDURE:  11/29/2016 DATE OF DISCHARGE:                              OPERATIVE REPORT   PREOPERATIVE DIAGNOSIS:  Postmenopausal bleeding, complex endometrial hyperplasia.  POSTOPERATIVE DIAGNOSIS:  Postmenopausal bleeding, complex endometrial hyperplasia.  PROCEDURE:  Hysteroscopy, D and C with ultrasound guidance.  SURGEON:  Pieter PartridgeEvelyn B Hermilo Dutter, MD.  ASSISTANT:  None.  ANESTHESIA:  Local and general.  BLOOD:  5.  URINE:  110 clear.  FLUID DEFICIT:  130 mL.  BLOOD ADMINISTERED:  None.  DRAINS:  None.  LOCAL:  2% lidocaine 10 mL.  SPECIMEN:  Endometrial curettings and endocervical curettings.  DISPOSITION OF SPECIMEN:  To pathology.  COUNTS:  Correct.  PATIENT DISPOSITION:  To PACU, hemodynamically stable.  COMPLICATIONS:  None.  FINDINGS:  Normal-appearing endometrial cavity.  Small uterus consistent with postmenopause.  Anteverted cervix appeared normal.  PROCEDURE IN DETAIL:  Ms. Renee Oliver was identified in the holding area. She was then taken to the operating room with IV running.  She was placed in the dorsal lithotomy position and underwent general endotracheal anesthesia without complication.  SCDs were on her legs and operating.  No IV antibiotics were administered.  A time-out was performed.  The patient was prepped and draped in normal sterile fashion.  I and O catheterization of the bladder was performed.  I attempted to place a Graves that was large, so we used a Pederson speculum.  Cervix was identified.  Anterior lip of the cervix was injected with lidocaine. Single-tooth tenaculum was then applied.  Then, a paracervical block was performed to complete 10 mL infusion.  [facility] dilators were used and we awaited for ultrasound to  arrive to make sure there was no risk of perforation.  The cervix was stenotic, especially in the internal os and she was very anteflexed.  Under direct visualization, the patient was dilated and a hysteroscope was advanced into the endometrial cavity.  The findings above were noted.  No gross abnormalities noted.  Hysteroscope was removed from the uterus.  Then, a sharp curettage of the endocervix and then the endometrium was performed.  Specimen sent separately.  There was an adequate curettage of the entire uterus; however, endometrium appeared scant.  All instruments were removed from the uterus.  The single-tooth tenaculum was removed from the anterior lip of the cervix and the tenaculum site was made hemostatic with silver nitrate.  There was no bleeding from the os.  All instruments were removed from the vagina.  Instrument and sponge counts were correct x3.  The patient was taken to the recovery room in stable condition.     Pieter PartridgeEvelyn B Mette Southgate, MD   ______________________________ Pieter PartridgeEvelyn B Naylani Bradner, MD    EBV/MEDQ  D:  11/29/2016  T:  11/29/2016  Job:  [id number]

## 2016-12-01 ENCOUNTER — Encounter (HOSPITAL_COMMUNITY): Payer: Self-pay | Admitting: Obstetrics and Gynecology

## 2016-12-07 DIAGNOSIS — I10 Essential (primary) hypertension: Secondary | ICD-10-CM | POA: Diagnosis not present

## 2016-12-07 DIAGNOSIS — F3342 Major depressive disorder, recurrent, in full remission: Secondary | ICD-10-CM | POA: Diagnosis not present

## 2016-12-20 DIAGNOSIS — Z79899 Other long term (current) drug therapy: Secondary | ICD-10-CM | POA: Diagnosis not present

## 2016-12-20 DIAGNOSIS — G8929 Other chronic pain: Secondary | ICD-10-CM | POA: Diagnosis not present

## 2016-12-20 DIAGNOSIS — M545 Low back pain: Secondary | ICD-10-CM | POA: Diagnosis not present

## 2017-01-10 IMAGING — MR MR LUMBAR SPINE WO/W CM
4 of 7 series · 16 of 48 positions shown · IV contrast (multihance)
Comparison: MRI lumbar spine without and with contrast 08/15/2006.
KUB 09/21/2011

CLINICAL DATA: Chronic low back pain without sciatica. Pain has
been getting progressively worse over time.

Creatinine was obtained on site at [HOSPITAL] at [HOSPITAL].
Results: Creatinine 0.8 mg/dL.
EXAM:
MRI LUMBAR SPINE WITHOUT AND WITH CONTRAST
TECHNIQUE: Multiplanar and multiecho pulse sequences of the lumbar spine were
obtained without and with intravenous contrast.
CONTRAST:  11mL MULTIHANCE GADOBENATE DIMEGLUMINE 529 MG/ML IV SOLN

[Series 6: T1 · sagittal · 4.0mm · 0.73mm/px · 3 of 15 slices shown (1 of 2)]
[im 1/15]
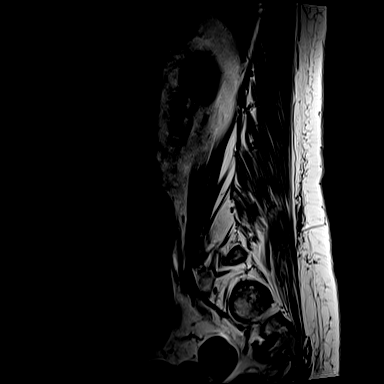
[im 10/15]
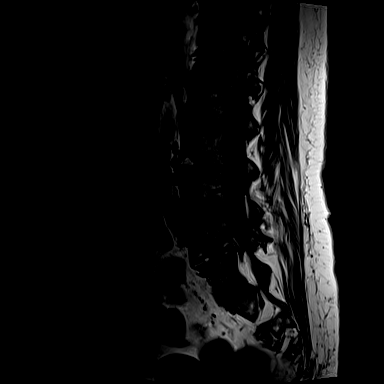
[im 15/15]
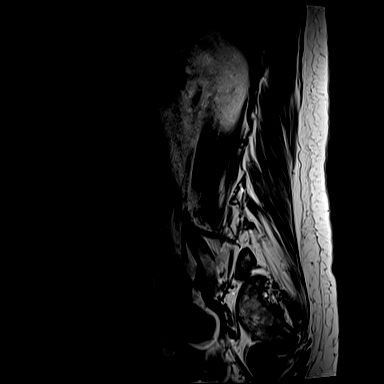

[Series 10: T1 · axial · 4.0mm · 0.28mm/px · z∈[-89,+60]mm · 3 of 33 slices shown (2 of 2)]
[im 4/33]
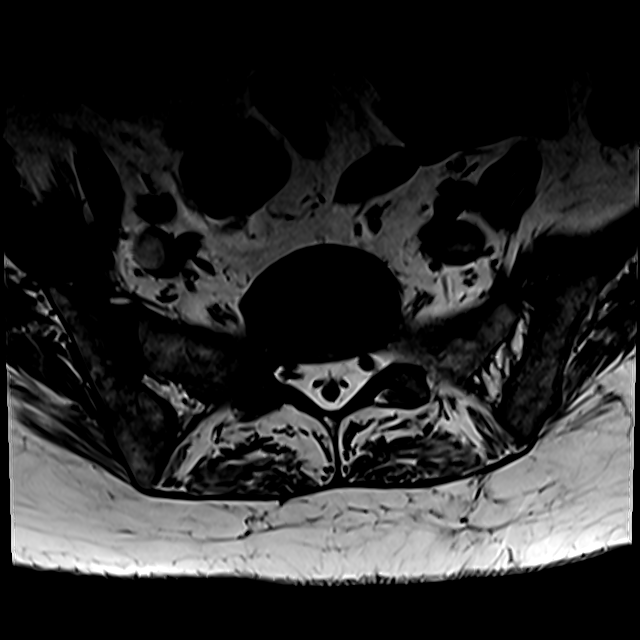
[im 18/33]
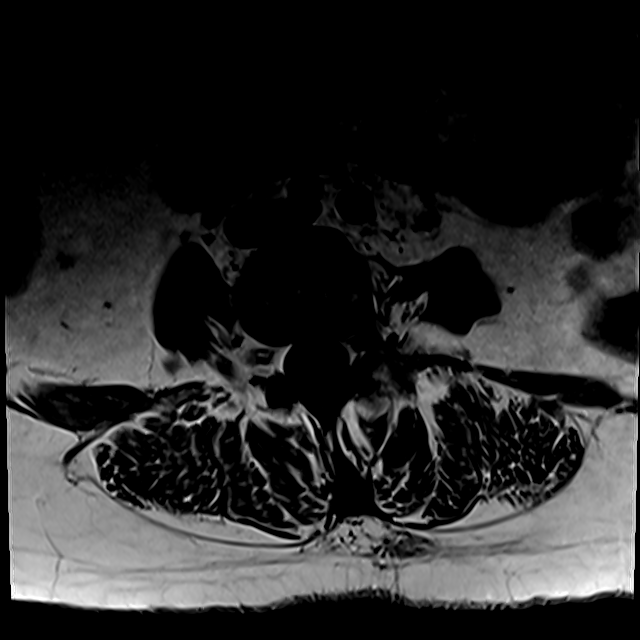
[im 29/33]
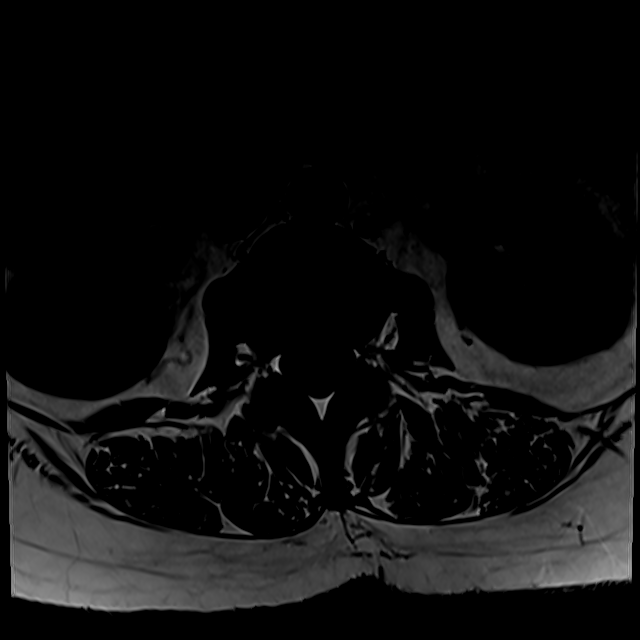

[Series 13: T2 · axial · 4.0mm · 0.28mm/px · z∈[-103,+60]mm · 7 of 33 slices shown (1 of 2)]
[im 1/33]
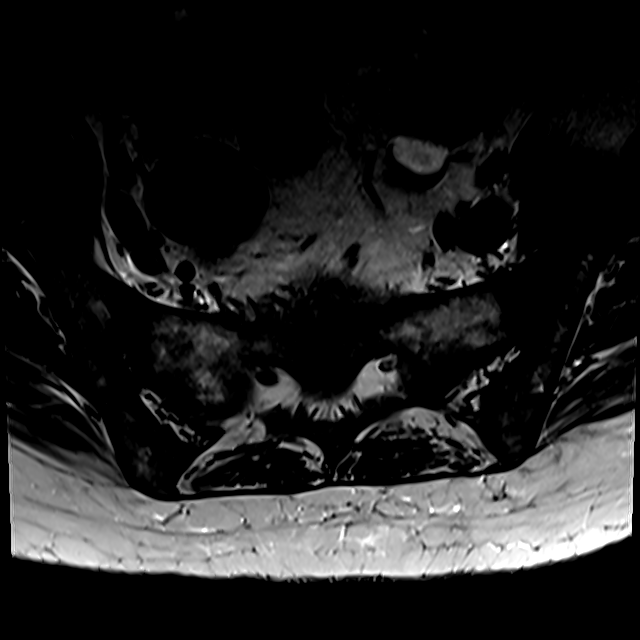
[im 4/33]
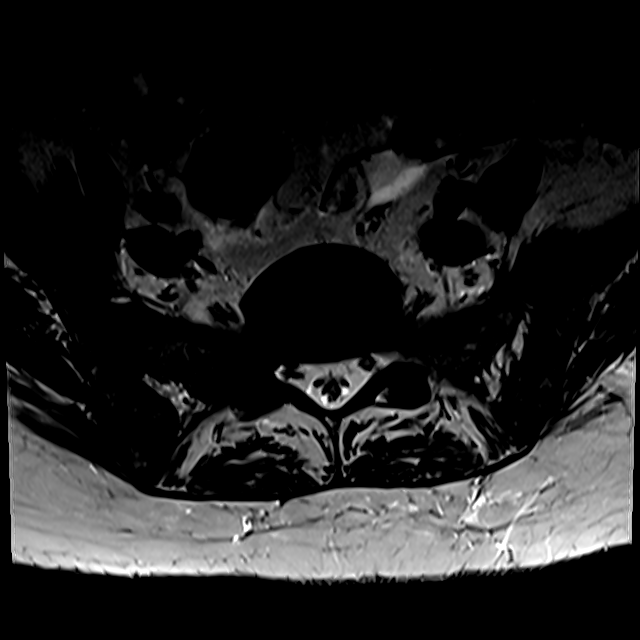
[im 11/33]
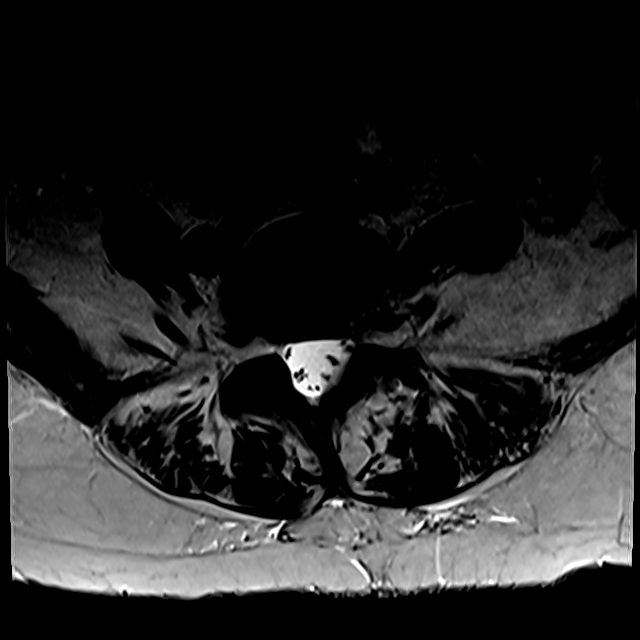
[im 15/33]
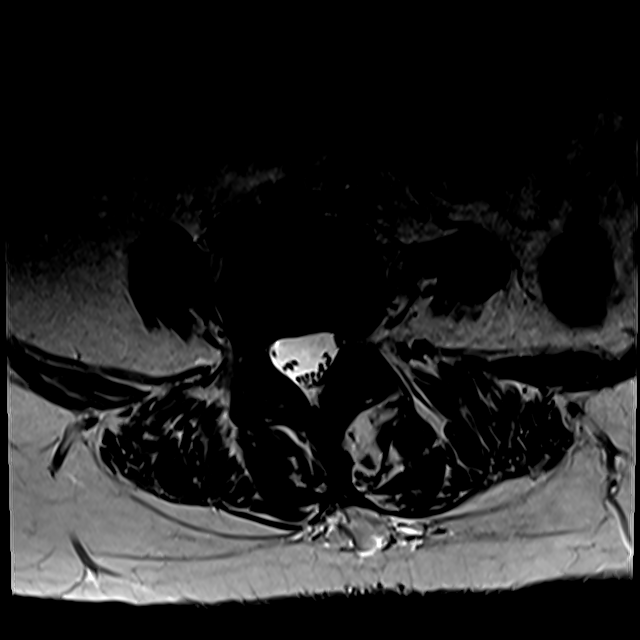
[im 18/33]
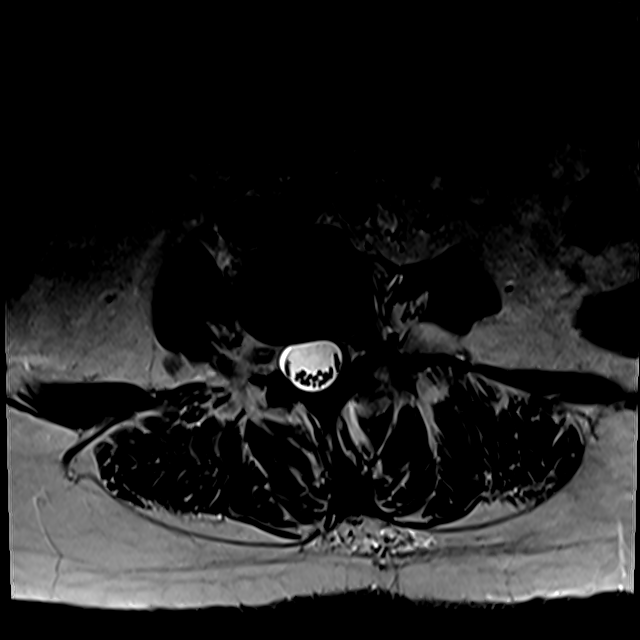
[im 22/33]
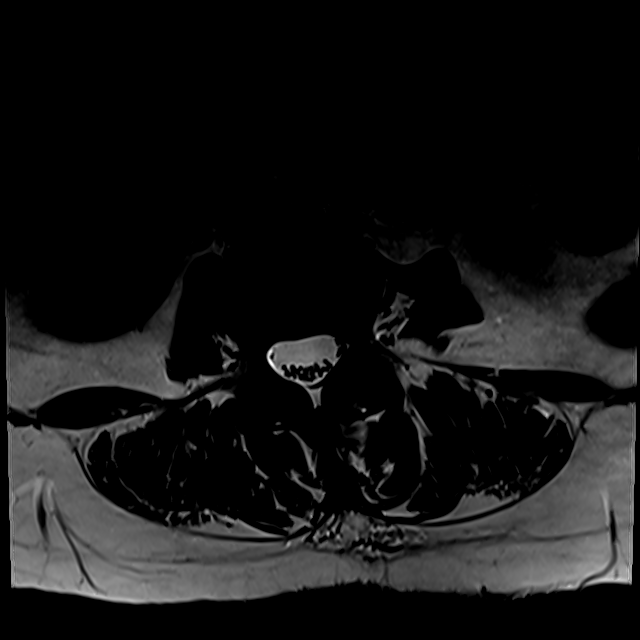
[im 29/33]
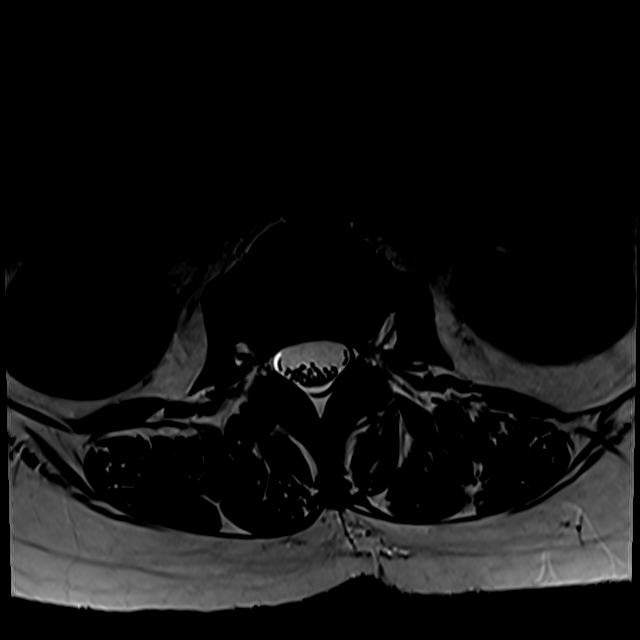

[Series 14: T2 · sagittal · 4.0mm · 0.73mm/px · 3 of 15 slices shown (2 of 2)]
[im 1/15]
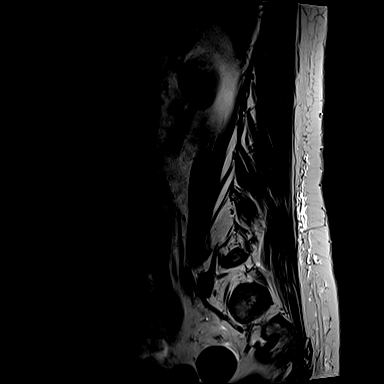
[im 8/15]
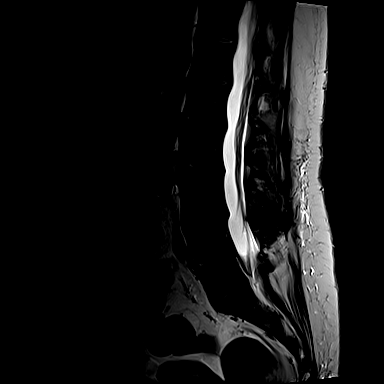
[im 15/15]
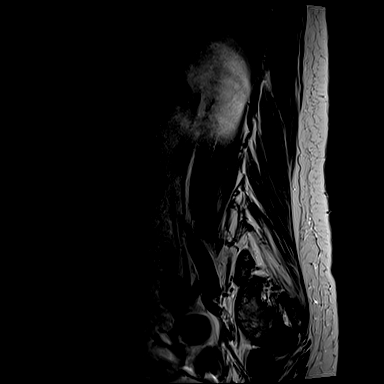

[16 of 48 positions shown; findings below may reference images not displayed]

FINDINGS: Segmentation: A transitional S1 segment is present. Five other non
rib-bearing lumbar type vertebral bodies are present. This differs
from the previous numbering system utilized.

Alignment: AP alignment is anatomic. Rightward curvature of the
lumbar spine is centered at L4.

Vertebrae: Chronic endplate marrow changes are present at L5-S1.
Marrow signal and vertebral body heights are maintained.

Conus medullaris: Extends to the L1 level and appears normal.

Paraspinal and other soft tissues: Limited imaging of the abdomen is
unremarkable. There is no significant adenopathy. There is mild
atrophy of the paraspinous musculature.

Disc levels:

L1-2:  Negative.

L2-3:  Negative.

L3-4: Mild facet hypertrophy is present without significant
stenosis.

L4-5: Mild leftward disc bulging is present. Mild left subarticular
narrowing is now present. The foramina are patent.

L5-S1: Left laminectomy is again noted. The central canal is patent.
Mild osseous narrowing on the left is stable.

S1-2:  Negative.
IMPRESSION: 1. Slight progression of leftward disc bulging at L4-5 with mild
left subarticular narrowing
2. Left laminectomy at L5-S1 without evidence for residual or
recurrent stenosis.
3. A transitional S1 segment is noted. This differs from the
numbering system used previously. This labeling scheme is confirmed
via the KUB plain film.

## 2017-01-19 DIAGNOSIS — M545 Low back pain: Secondary | ICD-10-CM | POA: Diagnosis not present

## 2017-01-19 DIAGNOSIS — Z79899 Other long term (current) drug therapy: Secondary | ICD-10-CM | POA: Diagnosis not present

## 2017-01-19 DIAGNOSIS — G8929 Other chronic pain: Secondary | ICD-10-CM | POA: Diagnosis not present

## 2017-02-10 DIAGNOSIS — Z79899 Other long term (current) drug therapy: Secondary | ICD-10-CM | POA: Diagnosis not present

## 2017-02-10 DIAGNOSIS — M545 Low back pain: Secondary | ICD-10-CM | POA: Diagnosis not present

## 2017-02-10 DIAGNOSIS — G8929 Other chronic pain: Secondary | ICD-10-CM | POA: Diagnosis not present

## 2017-03-15 DIAGNOSIS — Z79899 Other long term (current) drug therapy: Secondary | ICD-10-CM | POA: Diagnosis not present

## 2017-03-15 DIAGNOSIS — G8929 Other chronic pain: Secondary | ICD-10-CM | POA: Diagnosis not present

## 2017-03-15 DIAGNOSIS — M545 Low back pain: Secondary | ICD-10-CM | POA: Diagnosis not present

## 2017-04-13 DIAGNOSIS — G8929 Other chronic pain: Secondary | ICD-10-CM | POA: Diagnosis not present

## 2017-04-13 DIAGNOSIS — M545 Low back pain: Secondary | ICD-10-CM | POA: Diagnosis not present

## 2017-04-13 DIAGNOSIS — Z79899 Other long term (current) drug therapy: Secondary | ICD-10-CM | POA: Diagnosis not present

## 2017-05-30 DIAGNOSIS — F17200 Nicotine dependence, unspecified, uncomplicated: Secondary | ICD-10-CM | POA: Diagnosis not present

## 2017-05-30 DIAGNOSIS — J449 Chronic obstructive pulmonary disease, unspecified: Secondary | ICD-10-CM | POA: Diagnosis not present

## 2017-05-30 DIAGNOSIS — Z0000 Encounter for general adult medical examination without abnormal findings: Secondary | ICD-10-CM | POA: Diagnosis not present

## 2017-05-30 DIAGNOSIS — Z23 Encounter for immunization: Secondary | ICD-10-CM | POA: Diagnosis not present

## 2017-05-30 DIAGNOSIS — Z1211 Encounter for screening for malignant neoplasm of colon: Secondary | ICD-10-CM | POA: Diagnosis not present

## 2017-05-30 DIAGNOSIS — Z1159 Encounter for screening for other viral diseases: Secondary | ICD-10-CM | POA: Diagnosis not present

## 2017-05-30 DIAGNOSIS — Z1231 Encounter for screening mammogram for malignant neoplasm of breast: Secondary | ICD-10-CM | POA: Diagnosis not present

## 2017-05-30 DIAGNOSIS — Z13820 Encounter for screening for osteoporosis: Secondary | ICD-10-CM | POA: Diagnosis not present

## 2017-05-30 DIAGNOSIS — I10 Essential (primary) hypertension: Secondary | ICD-10-CM | POA: Diagnosis not present

## 2017-05-30 DIAGNOSIS — D692 Other nonthrombocytopenic purpura: Secondary | ICD-10-CM | POA: Diagnosis not present

## 2017-05-30 DIAGNOSIS — F3342 Major depressive disorder, recurrent, in full remission: Secondary | ICD-10-CM | POA: Diagnosis not present

## 2017-05-30 DIAGNOSIS — G2581 Restless legs syndrome: Secondary | ICD-10-CM | POA: Diagnosis not present

## 2017-06-11 ENCOUNTER — Other Ambulatory Visit: Payer: Self-pay | Admitting: Family Medicine

## 2017-06-11 DIAGNOSIS — Z1231 Encounter for screening mammogram for malignant neoplasm of breast: Secondary | ICD-10-CM

## 2017-06-13 DIAGNOSIS — Z79899 Other long term (current) drug therapy: Secondary | ICD-10-CM | POA: Diagnosis not present

## 2017-06-13 DIAGNOSIS — M545 Low back pain: Secondary | ICD-10-CM | POA: Diagnosis not present

## 2017-06-13 DIAGNOSIS — G8929 Other chronic pain: Secondary | ICD-10-CM | POA: Diagnosis not present

## 2017-07-12 DIAGNOSIS — M545 Low back pain: Secondary | ICD-10-CM | POA: Diagnosis not present

## 2017-07-12 DIAGNOSIS — Z79899 Other long term (current) drug therapy: Secondary | ICD-10-CM | POA: Diagnosis not present

## 2017-08-08 ENCOUNTER — Ambulatory Visit: Payer: PPO

## 2017-08-10 DIAGNOSIS — Z79899 Other long term (current) drug therapy: Secondary | ICD-10-CM | POA: Diagnosis not present

## 2017-08-10 DIAGNOSIS — G8929 Other chronic pain: Secondary | ICD-10-CM | POA: Diagnosis not present

## 2017-08-10 DIAGNOSIS — M545 Low back pain: Secondary | ICD-10-CM | POA: Diagnosis not present

## 2017-09-12 DIAGNOSIS — Z79899 Other long term (current) drug therapy: Secondary | ICD-10-CM | POA: Diagnosis not present

## 2017-09-12 DIAGNOSIS — G8929 Other chronic pain: Secondary | ICD-10-CM | POA: Diagnosis not present

## 2017-09-12 DIAGNOSIS — M545 Low back pain: Secondary | ICD-10-CM | POA: Diagnosis not present

## 2017-09-26 ENCOUNTER — Ambulatory Visit: Payer: PPO

## 2017-10-10 DIAGNOSIS — G8929 Other chronic pain: Secondary | ICD-10-CM | POA: Diagnosis not present

## 2017-10-10 DIAGNOSIS — M545 Low back pain: Secondary | ICD-10-CM | POA: Diagnosis not present

## 2017-10-10 DIAGNOSIS — Z79899 Other long term (current) drug therapy: Secondary | ICD-10-CM | POA: Diagnosis not present

## 2017-11-06 DIAGNOSIS — M545 Low back pain: Secondary | ICD-10-CM | POA: Diagnosis not present

## 2017-11-06 DIAGNOSIS — G8929 Other chronic pain: Secondary | ICD-10-CM | POA: Diagnosis not present

## 2017-11-06 DIAGNOSIS — Z79899 Other long term (current) drug therapy: Secondary | ICD-10-CM | POA: Diagnosis not present

## 2017-12-04 DIAGNOSIS — M47816 Spondylosis without myelopathy or radiculopathy, lumbar region: Secondary | ICD-10-CM | POA: Diagnosis not present

## 2017-12-04 DIAGNOSIS — Z79899 Other long term (current) drug therapy: Secondary | ICD-10-CM | POA: Diagnosis not present

## 2018-01-01 DIAGNOSIS — Z79899 Other long term (current) drug therapy: Secondary | ICD-10-CM | POA: Diagnosis not present

## 2018-01-01 DIAGNOSIS — G8929 Other chronic pain: Secondary | ICD-10-CM | POA: Diagnosis not present

## 2018-01-01 DIAGNOSIS — M545 Low back pain: Secondary | ICD-10-CM | POA: Diagnosis not present

## 2018-01-31 DIAGNOSIS — G8929 Other chronic pain: Secondary | ICD-10-CM | POA: Diagnosis not present

## 2018-01-31 DIAGNOSIS — Z79899 Other long term (current) drug therapy: Secondary | ICD-10-CM | POA: Diagnosis not present

## 2018-01-31 DIAGNOSIS — M545 Low back pain: Secondary | ICD-10-CM | POA: Diagnosis not present

## 2018-01-31 DIAGNOSIS — Z79891 Long term (current) use of opiate analgesic: Secondary | ICD-10-CM | POA: Diagnosis not present

## 2018-02-04 DIAGNOSIS — J449 Chronic obstructive pulmonary disease, unspecified: Secondary | ICD-10-CM | POA: Diagnosis not present

## 2018-02-04 DIAGNOSIS — N951 Menopausal and female climacteric states: Secondary | ICD-10-CM | POA: Diagnosis not present

## 2018-02-04 DIAGNOSIS — I10 Essential (primary) hypertension: Secondary | ICD-10-CM | POA: Diagnosis not present

## 2018-02-04 DIAGNOSIS — R58 Hemorrhage, not elsewhere classified: Secondary | ICD-10-CM | POA: Diagnosis not present

## 2018-02-04 DIAGNOSIS — F17210 Nicotine dependence, cigarettes, uncomplicated: Secondary | ICD-10-CM | POA: Diagnosis not present

## 2018-03-01 DIAGNOSIS — Z79891 Long term (current) use of opiate analgesic: Secondary | ICD-10-CM | POA: Diagnosis not present

## 2018-03-01 DIAGNOSIS — G8929 Other chronic pain: Secondary | ICD-10-CM | POA: Diagnosis not present

## 2018-03-01 DIAGNOSIS — M545 Low back pain: Secondary | ICD-10-CM | POA: Diagnosis not present

## 2018-03-01 DIAGNOSIS — Z79899 Other long term (current) drug therapy: Secondary | ICD-10-CM | POA: Diagnosis not present

## 2018-03-29 DIAGNOSIS — M545 Low back pain: Secondary | ICD-10-CM | POA: Diagnosis not present

## 2018-03-29 DIAGNOSIS — Z79891 Long term (current) use of opiate analgesic: Secondary | ICD-10-CM | POA: Diagnosis not present

## 2018-03-29 DIAGNOSIS — Z79899 Other long term (current) drug therapy: Secondary | ICD-10-CM | POA: Diagnosis not present

## 2018-03-29 DIAGNOSIS — G8929 Other chronic pain: Secondary | ICD-10-CM | POA: Diagnosis not present

## 2018-04-26 DIAGNOSIS — Z79899 Other long term (current) drug therapy: Secondary | ICD-10-CM | POA: Diagnosis not present

## 2018-04-26 DIAGNOSIS — Z79891 Long term (current) use of opiate analgesic: Secondary | ICD-10-CM | POA: Diagnosis not present

## 2018-04-26 DIAGNOSIS — M545 Low back pain: Secondary | ICD-10-CM | POA: Diagnosis not present

## 2018-04-26 DIAGNOSIS — G8929 Other chronic pain: Secondary | ICD-10-CM | POA: Diagnosis not present

## 2018-05-24 DIAGNOSIS — Z79891 Long term (current) use of opiate analgesic: Secondary | ICD-10-CM | POA: Diagnosis not present

## 2018-05-24 DIAGNOSIS — M545 Low back pain: Secondary | ICD-10-CM | POA: Diagnosis not present

## 2018-05-24 DIAGNOSIS — G8929 Other chronic pain: Secondary | ICD-10-CM | POA: Diagnosis not present

## 2018-05-24 DIAGNOSIS — Z79899 Other long term (current) drug therapy: Secondary | ICD-10-CM | POA: Diagnosis not present

## 2018-06-20 DIAGNOSIS — G8929 Other chronic pain: Secondary | ICD-10-CM | POA: Diagnosis not present

## 2018-06-20 DIAGNOSIS — Z79899 Other long term (current) drug therapy: Secondary | ICD-10-CM | POA: Diagnosis not present

## 2018-06-20 DIAGNOSIS — M545 Low back pain: Secondary | ICD-10-CM | POA: Diagnosis not present

## 2018-06-20 DIAGNOSIS — Z79891 Long term (current) use of opiate analgesic: Secondary | ICD-10-CM | POA: Diagnosis not present

## 2018-07-19 DIAGNOSIS — Z79891 Long term (current) use of opiate analgesic: Secondary | ICD-10-CM | POA: Diagnosis not present

## 2018-07-19 DIAGNOSIS — Z79899 Other long term (current) drug therapy: Secondary | ICD-10-CM | POA: Diagnosis not present

## 2018-07-19 DIAGNOSIS — G8929 Other chronic pain: Secondary | ICD-10-CM | POA: Diagnosis not present

## 2018-07-19 DIAGNOSIS — M545 Low back pain: Secondary | ICD-10-CM | POA: Diagnosis not present

## 2018-08-08 DIAGNOSIS — R808 Other proteinuria: Secondary | ICD-10-CM | POA: Diagnosis not present

## 2018-08-08 DIAGNOSIS — Z23 Encounter for immunization: Secondary | ICD-10-CM | POA: Diagnosis not present

## 2018-08-08 DIAGNOSIS — J449 Chronic obstructive pulmonary disease, unspecified: Secondary | ICD-10-CM | POA: Diagnosis not present

## 2018-08-08 DIAGNOSIS — I10 Essential (primary) hypertension: Secondary | ICD-10-CM | POA: Diagnosis not present

## 2018-08-08 DIAGNOSIS — E782 Mixed hyperlipidemia: Secondary | ICD-10-CM | POA: Diagnosis not present

## 2018-08-08 DIAGNOSIS — F17200 Nicotine dependence, unspecified, uncomplicated: Secondary | ICD-10-CM | POA: Diagnosis not present

## 2018-08-08 DIAGNOSIS — Z0000 Encounter for general adult medical examination without abnormal findings: Secondary | ICD-10-CM | POA: Diagnosis not present

## 2018-08-08 DIAGNOSIS — Z1231 Encounter for screening mammogram for malignant neoplasm of breast: Secondary | ICD-10-CM | POA: Diagnosis not present

## 2018-08-13 ENCOUNTER — Other Ambulatory Visit: Payer: Self-pay | Admitting: Family Medicine

## 2018-08-13 DIAGNOSIS — Z1231 Encounter for screening mammogram for malignant neoplasm of breast: Secondary | ICD-10-CM

## 2018-08-20 DIAGNOSIS — Z6821 Body mass index (BMI) 21.0-21.9, adult: Secondary | ICD-10-CM | POA: Diagnosis not present

## 2018-08-20 DIAGNOSIS — M545 Low back pain: Secondary | ICD-10-CM | POA: Diagnosis not present

## 2018-08-20 DIAGNOSIS — Z79899 Other long term (current) drug therapy: Secondary | ICD-10-CM | POA: Diagnosis not present

## 2018-08-20 DIAGNOSIS — G8929 Other chronic pain: Secondary | ICD-10-CM | POA: Diagnosis not present

## 2018-08-20 DIAGNOSIS — Z79891 Long term (current) use of opiate analgesic: Secondary | ICD-10-CM | POA: Diagnosis not present

## 2018-09-18 DIAGNOSIS — G8929 Other chronic pain: Secondary | ICD-10-CM | POA: Diagnosis not present

## 2018-09-18 DIAGNOSIS — Z79891 Long term (current) use of opiate analgesic: Secondary | ICD-10-CM | POA: Diagnosis not present

## 2018-09-18 DIAGNOSIS — M545 Low back pain: Secondary | ICD-10-CM | POA: Diagnosis not present

## 2018-09-18 DIAGNOSIS — Z79899 Other long term (current) drug therapy: Secondary | ICD-10-CM | POA: Diagnosis not present

## 2018-09-18 DIAGNOSIS — F1120 Opioid dependence, uncomplicated: Secondary | ICD-10-CM | POA: Diagnosis not present

## 2018-10-16 DIAGNOSIS — Z79891 Long term (current) use of opiate analgesic: Secondary | ICD-10-CM | POA: Diagnosis not present

## 2018-10-16 DIAGNOSIS — Z79899 Other long term (current) drug therapy: Secondary | ICD-10-CM | POA: Diagnosis not present

## 2018-10-16 DIAGNOSIS — M545 Low back pain: Secondary | ICD-10-CM | POA: Diagnosis not present

## 2018-10-16 DIAGNOSIS — Z6821 Body mass index (BMI) 21.0-21.9, adult: Secondary | ICD-10-CM | POA: Diagnosis not present

## 2018-10-16 DIAGNOSIS — G8929 Other chronic pain: Secondary | ICD-10-CM | POA: Diagnosis not present

## 2018-11-13 DIAGNOSIS — G8929 Other chronic pain: Secondary | ICD-10-CM | POA: Diagnosis not present

## 2018-11-13 DIAGNOSIS — M545 Low back pain: Secondary | ICD-10-CM | POA: Diagnosis not present

## 2018-11-13 DIAGNOSIS — Z79899 Other long term (current) drug therapy: Secondary | ICD-10-CM | POA: Diagnosis not present

## 2018-11-13 DIAGNOSIS — Z79891 Long term (current) use of opiate analgesic: Secondary | ICD-10-CM | POA: Diagnosis not present

## 2018-11-13 DIAGNOSIS — Z6821 Body mass index (BMI) 21.0-21.9, adult: Secondary | ICD-10-CM | POA: Diagnosis not present

## 2018-11-14 DIAGNOSIS — Z79899 Other long term (current) drug therapy: Secondary | ICD-10-CM | POA: Diagnosis not present

## 2018-12-11 DIAGNOSIS — G8929 Other chronic pain: Secondary | ICD-10-CM | POA: Diagnosis not present

## 2018-12-11 DIAGNOSIS — Z79899 Other long term (current) drug therapy: Secondary | ICD-10-CM | POA: Diagnosis not present

## 2018-12-11 DIAGNOSIS — Z6821 Body mass index (BMI) 21.0-21.9, adult: Secondary | ICD-10-CM | POA: Diagnosis not present

## 2018-12-11 DIAGNOSIS — M545 Low back pain: Secondary | ICD-10-CM | POA: Diagnosis not present

## 2018-12-11 DIAGNOSIS — Z79891 Long term (current) use of opiate analgesic: Secondary | ICD-10-CM | POA: Diagnosis not present

## 2018-12-31 DIAGNOSIS — N950 Postmenopausal bleeding: Secondary | ICD-10-CM | POA: Diagnosis not present

## 2018-12-31 DIAGNOSIS — Z720 Tobacco use: Secondary | ICD-10-CM | POA: Diagnosis not present

## 2019-01-08 DIAGNOSIS — G8929 Other chronic pain: Secondary | ICD-10-CM | POA: Diagnosis not present

## 2019-01-08 DIAGNOSIS — Z79891 Long term (current) use of opiate analgesic: Secondary | ICD-10-CM | POA: Diagnosis not present

## 2019-01-08 DIAGNOSIS — Z79899 Other long term (current) drug therapy: Secondary | ICD-10-CM | POA: Diagnosis not present

## 2019-01-08 DIAGNOSIS — M545 Low back pain: Secondary | ICD-10-CM | POA: Diagnosis not present

## 2019-01-08 DIAGNOSIS — F17210 Nicotine dependence, cigarettes, uncomplicated: Secondary | ICD-10-CM | POA: Diagnosis not present

## 2019-01-08 DIAGNOSIS — F1120 Opioid dependence, uncomplicated: Secondary | ICD-10-CM | POA: Diagnosis not present

## 2019-01-30 DIAGNOSIS — B370 Candidal stomatitis: Secondary | ICD-10-CM | POA: Diagnosis not present

## 2019-01-30 DIAGNOSIS — Z1239 Encounter for other screening for malignant neoplasm of breast: Secondary | ICD-10-CM | POA: Diagnosis not present

## 2019-02-06 DIAGNOSIS — Z79899 Other long term (current) drug therapy: Secondary | ICD-10-CM | POA: Diagnosis not present

## 2019-02-06 DIAGNOSIS — F17210 Nicotine dependence, cigarettes, uncomplicated: Secondary | ICD-10-CM | POA: Diagnosis not present

## 2019-02-06 DIAGNOSIS — M545 Low back pain: Secondary | ICD-10-CM | POA: Diagnosis not present

## 2019-02-06 DIAGNOSIS — Z79891 Long term (current) use of opiate analgesic: Secondary | ICD-10-CM | POA: Diagnosis not present

## 2019-02-06 DIAGNOSIS — G8929 Other chronic pain: Secondary | ICD-10-CM | POA: Diagnosis not present

## 2019-03-04 DIAGNOSIS — G8929 Other chronic pain: Secondary | ICD-10-CM | POA: Diagnosis not present

## 2019-03-04 DIAGNOSIS — I10 Essential (primary) hypertension: Secondary | ICD-10-CM | POA: Diagnosis not present

## 2019-03-04 DIAGNOSIS — M545 Low back pain: Secondary | ICD-10-CM | POA: Diagnosis not present

## 2019-03-04 DIAGNOSIS — Z79899 Other long term (current) drug therapy: Secondary | ICD-10-CM | POA: Diagnosis not present

## 2019-03-11 DIAGNOSIS — Z1239 Encounter for other screening for malignant neoplasm of breast: Secondary | ICD-10-CM | POA: Diagnosis not present

## 2019-03-11 DIAGNOSIS — G894 Chronic pain syndrome: Secondary | ICD-10-CM | POA: Diagnosis not present

## 2019-03-11 DIAGNOSIS — I10 Essential (primary) hypertension: Secondary | ICD-10-CM | POA: Diagnosis not present

## 2019-03-11 DIAGNOSIS — F17210 Nicotine dependence, cigarettes, uncomplicated: Secondary | ICD-10-CM | POA: Diagnosis not present

## 2019-03-12 ENCOUNTER — Other Ambulatory Visit: Payer: Self-pay | Admitting: Family Medicine

## 2019-03-12 DIAGNOSIS — Z1231 Encounter for screening mammogram for malignant neoplasm of breast: Secondary | ICD-10-CM

## 2019-04-01 DIAGNOSIS — M47816 Spondylosis without myelopathy or radiculopathy, lumbar region: Secondary | ICD-10-CM | POA: Diagnosis not present

## 2019-04-01 DIAGNOSIS — Z79899 Other long term (current) drug therapy: Secondary | ICD-10-CM | POA: Diagnosis not present

## 2019-04-02 DIAGNOSIS — I10 Essential (primary) hypertension: Secondary | ICD-10-CM | POA: Diagnosis not present

## 2019-04-15 DIAGNOSIS — H2513 Age-related nuclear cataract, bilateral: Secondary | ICD-10-CM | POA: Diagnosis not present

## 2019-04-16 DIAGNOSIS — I10 Essential (primary) hypertension: Secondary | ICD-10-CM | POA: Diagnosis not present

## 2019-04-16 DIAGNOSIS — Z1329 Encounter for screening for other suspected endocrine disorder: Secondary | ICD-10-CM | POA: Diagnosis not present

## 2019-05-02 ENCOUNTER — Ambulatory Visit: Payer: PPO

## 2019-05-05 DIAGNOSIS — Z79899 Other long term (current) drug therapy: Secondary | ICD-10-CM | POA: Diagnosis not present

## 2019-05-05 DIAGNOSIS — M129 Arthropathy, unspecified: Secondary | ICD-10-CM | POA: Diagnosis not present

## 2019-05-05 DIAGNOSIS — M961 Postlaminectomy syndrome, not elsewhere classified: Secondary | ICD-10-CM | POA: Diagnosis not present

## 2019-05-05 DIAGNOSIS — G894 Chronic pain syndrome: Secondary | ICD-10-CM | POA: Diagnosis not present

## 2019-05-05 DIAGNOSIS — E559 Vitamin D deficiency, unspecified: Secondary | ICD-10-CM | POA: Diagnosis not present

## 2019-06-04 DIAGNOSIS — R002 Palpitations: Secondary | ICD-10-CM | POA: Diagnosis not present

## 2019-06-04 DIAGNOSIS — I10 Essential (primary) hypertension: Secondary | ICD-10-CM | POA: Diagnosis not present

## 2019-06-05 DIAGNOSIS — E559 Vitamin D deficiency, unspecified: Secondary | ICD-10-CM | POA: Diagnosis not present

## 2019-06-05 DIAGNOSIS — Z79899 Other long term (current) drug therapy: Secondary | ICD-10-CM | POA: Diagnosis not present

## 2019-06-05 DIAGNOSIS — M47816 Spondylosis without myelopathy or radiculopathy, lumbar region: Secondary | ICD-10-CM | POA: Diagnosis not present

## 2019-06-05 DIAGNOSIS — G894 Chronic pain syndrome: Secondary | ICD-10-CM | POA: Diagnosis not present

## 2019-06-24 ENCOUNTER — Other Ambulatory Visit: Payer: Self-pay | Admitting: Obstetrics and Gynecology

## 2019-06-24 DIAGNOSIS — N8501 Benign endometrial hyperplasia: Secondary | ICD-10-CM | POA: Diagnosis not present

## 2019-06-24 DIAGNOSIS — N950 Postmenopausal bleeding: Secondary | ICD-10-CM | POA: Diagnosis not present

## 2019-07-03 ENCOUNTER — Telehealth: Payer: Self-pay | Admitting: *Deleted

## 2019-07-03 ENCOUNTER — Other Ambulatory Visit: Payer: Self-pay | Admitting: *Deleted

## 2019-07-03 DIAGNOSIS — R002 Palpitations: Secondary | ICD-10-CM

## 2019-07-03 DIAGNOSIS — R000 Tachycardia, unspecified: Secondary | ICD-10-CM

## 2019-07-03 NOTE — Telephone Encounter (Signed)
Deleted wrong cell phone number from our records. Please call [facility] at [phone] to set up your ZIO patch cardiac holter monitor as requested by Dr. Curly Rim.

## 2019-07-04 DIAGNOSIS — Z23 Encounter for immunization: Secondary | ICD-10-CM | POA: Diagnosis not present

## 2019-07-04 DIAGNOSIS — Z79899 Other long term (current) drug therapy: Secondary | ICD-10-CM | POA: Diagnosis not present

## 2019-07-04 DIAGNOSIS — G894 Chronic pain syndrome: Secondary | ICD-10-CM | POA: Diagnosis not present

## 2019-07-04 DIAGNOSIS — M961 Postlaminectomy syndrome, not elsewhere classified: Secondary | ICD-10-CM | POA: Diagnosis not present

## 2019-07-07 ENCOUNTER — Ambulatory Visit: Payer: PPO

## 2019-07-09 ENCOUNTER — Other Ambulatory Visit: Payer: Self-pay

## 2019-07-09 ENCOUNTER — Encounter: Payer: Self-pay | Admitting: Cardiology

## 2019-07-09 ENCOUNTER — Ambulatory Visit: Payer: PPO | Admitting: Cardiology

## 2019-07-09 VITALS — BP 155/97 | HR 117 | Ht 63.0 in | Wt 123.8 lb

## 2019-07-09 DIAGNOSIS — Z716 Tobacco abuse counseling: Secondary | ICD-10-CM | POA: Diagnosis not present

## 2019-07-09 DIAGNOSIS — Z720 Tobacco use: Secondary | ICD-10-CM | POA: Diagnosis not present

## 2019-07-09 DIAGNOSIS — R002 Palpitations: Secondary | ICD-10-CM | POA: Diagnosis not present

## 2019-07-09 DIAGNOSIS — Z7189 Other specified counseling: Secondary | ICD-10-CM | POA: Diagnosis not present

## 2019-07-09 NOTE — Patient Instructions (Addendum)
Medication Instructions:  Your Physician recommend you continue on your current medication as directed.    If you need a refill on your cardiac medications before your next appointment, please call your pharmacy.   Lab work: None  Testing/Procedures: Your physician has requested that you have an echocardiogram. Echocardiography is a painless test that uses sound waves to create images of your heart. It provides your doctor with information about the size and shape of your heart and how well your heart's chambers and valves are working. This procedure takes approximately one hour. There are no restrictions for this procedure. [address]   Follow-Up: At [facility], you and your health needs are our priority.  As part of our continuing mission to provide you with exceptional heart care, we have created designated Provider Care Teams.  These Care Teams include your primary Cardiologist (physician) and Advanced Practice Providers (APPs -  Physician Assistants and Nurse Practitioners) who all work together to provide you with the care you need, when you need it. You will need a follow up appointment in 6 weeks.  Please call our office 2 months in advance to schedule this appointment.  You may see Dr. Harrell Gave or one of the following Advanced Practice Providers on your designated Care Team:   Rosaria Ferries, PA-C . Jory Sims, DNP, ANP

## 2019-07-09 NOTE — Progress Notes (Signed)
Cardiology Office Note:    Date:  07/09/2019   ID:  Renee Oliver, DOB May 07, 1956, MRN [id number]  PCP:  Joycelyn RuaMeyers, Stephen, MD  Cardiologist:  Jodelle RedBridgette Mardell Suttles, MD  Referring MD: Joycelyn RuaMeyers, Stephen, MD   CC: new patient consult for palpitations  History of Present Illness:    Renee Oliver is a 63 y.o. female with a hx of HTN who is seen as a new consult at the request of Joycelyn RuaMeyers, Stephen, MD for the evaluation and management of palpitations.  Records scanned under media tab, reviewed today. Reports heart racing with certain medications, up to 120 bpm, without associated symptoms.   Patient's other concerns today: goes to a pain management doctor. Was taken off oxycontin 30 mg medication, trying to find another medication that is long acting. Asking for a copy of ECG to be sent to Merryl HackerJanelle Grossman, [facility]C at [facility] as well as any comments on buprenorphine. Fax [phone].  Tachycardia/palpitations: -Initial onset: first noted end of June or July 2020, was having an elevated BP and elevated HR as well. Was on bisoprolol before, HR in 70s. More recently HR has been 90s-120s. 04/16/19 visit had bisoprolol stopped, started on losartan (which has been increased from 25 mg to 100 mg) along with HCTZ. Did not wean bisoprolol, was on 10 mg daily. Had an episode of tachycardia up to 130 bpm with a generic inhaler, was told not use this anymore. Breathing has gotten worse since stopping the bisoprolol. -Frequency/Duration: constant. -Associated symptoms: no chest pain, lightheadedness, nausea. Sometimes headaches and shortness of breath -Aggravating/alleviating factors:better if she takes deep breaths and relaxes. -Syncope/near syncope: none -Prior cardiac history: none -Prior ECG:  -Prior workup: none, pending heart monitor ordered by Dr. Redgie GrayerMasneri -Prior treatment: none -Possible medication interactions: quick stop of beta blocker -Caffeine: drinks a lot of tea every  day, otherwise water. -Alcohol: few glasses of wine every night. -Tobacco: current, 0.5-1 ppd since she was young. Never successful in quitting. Thinking about hypnosis, wants to quit.  -OTC supplements: none -Comorbidities: poor sleep, only gets 3-4 hours sleep/night. Light sleeper, can't go back to sleep when she wakes up -Exercise level: tries to walk, but overall sedentary. Out of breath easily -Labs: TSH, kidney function/electrolytes, CBC reviewed. -Cardiac ROS: no PND, no orthopnea, no LE edema. -Family history: father in his early 1580s had 4V CABG. Mother had HTN.  Past Medical History:  Diagnosis Date  . Anxiety   . Asthma   . Chronic back pain   . COPD (chronic obstructive pulmonary disease) (HCC)   . DDD (degenerative disc disease), lumbar   . Depression   . Hypertension     Past Surgical History:  Procedure Laterality Date  . BACK SURGERY    . BILATERAL SALPINGECTOMY    . CHOLECYSTECTOMY    . DIAGNOSTIC LAPAROSCOPY    . DILATATION & CURETTAGE/HYSTEROSCOPY WITH MYOSURE N/A 11/29/2016   Procedure: DILATATION & CURETTAGE/HYSTEROSCOPY;  Surgeon: Geryl RankinsEvelyn Varnado, MD;  Location: [facility];  Service: Gynecology;  Laterality: N/A;  Ultrasound Guidance  . PARTIAL HYSTERECTOMY    . RIGHT OOPHORECTOMY    . TONSILLECTOMY      Current Medications: Current Outpatient Medications on File Prior to Visit  Medication Sig  . budesonide-formoterol (SYMBICORT) 160-4.5 MCG/ACT inhaler Inhale 2 puffs into the lungs 2 (two) times daily.  . DULoxetine (CYMBALTA) 60 MG capsule Take 60 mg by mouth 2 (two) times daily.  . hydrochlorothiazide (HYDRODIURIL) 12.5 MG tablet Take 12.5 mg by mouth  daily.  . loperamide (IMODIUM A-D) 2 MG capsule Take 2 mg by mouth as needed for diarrhea or loose stools.  Marland Kitchen losartan (COZAAR) 100 MG tablet Take 100 mg by mouth daily.  Marland Kitchen oxyCODONE-acetaminophen (PERCOCET) 10-325 MG tablet Take 1 tablet by mouth 3 (three) times daily.  . pramipexole (MIRAPEX) 0.25 MG tablet  Take 0.25 mg by mouth at bedtime.   No current facility-administered medications on file prior to visit.      Allergies:   Amoxicillin, Aspirin, Nsaids, Prednisone, and Iodinated diagnostic agents   Social History   Tobacco Use  . Smoking status: Current Every Day Smoker    Packs/day: 1.00    Years: 42.00    Pack years: 42.00    Types: Cigarettes  . Smokeless tobacco: Never Used  Substance Use Topics  . Alcohol use: Yes  . Drug use: No    Family History: father in his early 33s had 4V CABG. Mother had HTN.  ROS:   Please see the history of present illness.  Additional pertinent ROS: Constitutional: Negative for chills, fever, night sweats, unintentional weight loss  HENT: Negative for ear pain and hearing loss.   Eyes: Negative for loss of vision and eye pain.  Respiratory: Negative for cough, sputum, wheezing.   Cardiovascular: See HPI. Gastrointestinal: Negative for abdominal pain, melena, and hematochezia.  Genitourinary: Negative for dysuria and hematuria.  Musculoskeletal: Negative for falls and myalgias.  Skin: Negative for itching and rash.  Neurological: Negative for focal weakness, focal sensory changes and loss of consciousness.  Endo/Heme/Allergies: Does not bruise/bleed easily.     EKGs/Labs/Other Studies Reviewed:    The following studies were reviewed today: No available cardiac studies  EKG:  EKG is personally reviewed.  The ekg ordered today demonstrates NSR  Recent Labs: No results found for requested labs within last 8760 hours.  Recent Lipid Panel No results found for: CHOL, TRIG, HDL, CHOLHDL, VLDL, LDLCALC, LDLDIRECT  Physical Exam:    VS:  BP (!) 155/97   Pulse (!) 117   Ht 5\' 3"  (1.6 m)   Wt 123 lb 12.8 oz (56.2 kg)   SpO2 91%   BMI 21.93 kg/m     Wt Readings from Last 3 Encounters:  07/09/19 123 lb 12.8 oz (56.2 kg)  11/29/16 122 lb (55.3 kg)  11/28/16 122 lb 2 oz (55.4 kg)    GEN: Well nourished, well developed in no acute  distress HEENT: Normal, moist mucous membranes NECK: No JVD CARDIAC: regular rhythm, normal S1 and S2, no murmurs, rubs, gallops.  VASCULAR: Radial and DP pulses 2+ bilaterally. No carotid bruits RESPIRATORY:  Clear to auscultation without rales, wheezing or rhonchi  ABDOMEN: Soft, non-tender, non-distended MUSCULOSKELETAL:  Ambulates independently SKIN: Warm and dry, no edema NEUROLOGIC:  Alert and oriented x 3. No focal neuro deficits noted. PSYCHIATRIC:  Normal affect    ASSESSMENT:    1. Palpitation   2. Cardiac risk counseling   3. Counseling on health promotion and disease prevention   4. Tobacco abuse   5. Tobacco abuse counseling    PLAN:    Palpitations: suspect this may be secondary to medications, tobacco, or pain -will order monitor, echo to exclude cardiac pathology -ECG unremarkable today  Tobacco abuse counseling: The patient was counseled on tobacco cessation today for 5 minutes.  Counseling included reviewing the risks of smoking tobacco products, how it impacts the patient's current medical diagnoses and different strategies for quitting.  Pharmacotherapy to aid in tobacco cessation was  not prescribed today.  Cardiac risk counseling and prevention recommendations: -recommend heart healthy/Mediterranean diet, with whole grains, fruits, vegetable, fish, lean meats, nuts, and olive oil. Limit salt. -recommend moderate walking, 3-5 times/week for 30-50 minutes each session. Aim for at least 150 minutes.week. Goal should be pace of 3 miles/hours, or walking 1.5 miles in 30 minutes -recommend avoidance of tobacco products. Avoid excess alcohol.  Plan for follow up: 6 weeks  Medication Adjustments/Labs and Tests Ordered: Current medicines are reviewed at length with the patient today.  Concerns regarding medicines are outlined above.  Orders Placed This Encounter  Procedures  . EKG 12-Lead  . ECHOCARDIOGRAM COMPLETE   No orders of the defined types were placed in  this encounter.   Patient Instructions  Medication Instructions:  Your Physician recommend you continue on your current medication as directed.    If you need a refill on your cardiac medications before your next appointment, please call your pharmacy.   Lab work: None  Testing/Procedures: Your physician has requested that you have an echocardiogram. Echocardiography is a painless test that uses sound waves to create images of your heart. It provides your doctor with information about the size and shape of your heart and how well your heart's chambers and valves are working. This procedure takes approximately one hour. There are no restrictions for this procedure. [address]   Follow-Up: At [facility], you and your health needs are our priority.  As part of our continuing mission to provide you with exceptional heart care, we have created designated Provider Care Teams.  These Care Teams include your primary Cardiologist (physician) and Advanced Practice Providers (APPs -  Physician Assistants and Nurse Practitioners) who all work together to provide you with the care you need, when you need it. You will need a follow up appointment in 6 weeks.  Please call our office 2 months in advance to schedule this appointment.  You may see Dr. Cristal Deer or one of the following Advanced Practice Providers on your designated Care Team:   Theodore Demark, PA-C . Joni Reining, DNP, ANP      Signed, Jodelle Red, MD PhD 07/09/2019  [facility]

## 2019-07-11 ENCOUNTER — Other Ambulatory Visit: Payer: Self-pay

## 2019-07-11 ENCOUNTER — Ambulatory Visit (HOSPITAL_COMMUNITY): Payer: PPO | Attending: Cardiology

## 2019-07-11 DIAGNOSIS — R002 Palpitations: Secondary | ICD-10-CM | POA: Diagnosis not present

## 2019-07-11 DIAGNOSIS — Z79899 Other long term (current) drug therapy: Secondary | ICD-10-CM | POA: Diagnosis not present

## 2019-07-16 ENCOUNTER — Telehealth: Payer: Self-pay | Admitting: Cardiology

## 2019-07-16 NOTE — Telephone Encounter (Signed)
D/w Dr Harrell Gave she will fax.

## 2019-07-16 NOTE — Telephone Encounter (Signed)
New Message  Patient states that she needs a copy of her EKG tracing faxed to her pain management physician. Fax # [phone] Attn to Santiago Glad. Patient states that this is urgent and office has been waiting since Monday. Please give patient a call back after fax has been sent.

## 2019-07-16 NOTE — Telephone Encounter (Signed)
Medical records is looking for it. We are unable to locate

## 2019-07-23 ENCOUNTER — Telehealth: Payer: Self-pay | Admitting: *Deleted

## 2019-07-23 DIAGNOSIS — I10 Essential (primary) hypertension: Secondary | ICD-10-CM | POA: Diagnosis not present

## 2019-07-23 DIAGNOSIS — R000 Tachycardia, unspecified: Secondary | ICD-10-CM | POA: Diagnosis not present

## 2019-07-23 NOTE — Telephone Encounter (Signed)
14 day ZIO XT long term holter monitor to be mailed to the patients home.  Instructions reviewed briefly as they are included in the monitor kit. 

## 2019-07-24 ENCOUNTER — Encounter: Payer: Self-pay | Admitting: Cardiology

## 2019-07-30 ENCOUNTER — Ambulatory Visit (INDEPENDENT_AMBULATORY_CARE_PROVIDER_SITE_OTHER): Payer: PPO

## 2019-07-30 DIAGNOSIS — R002 Palpitations: Secondary | ICD-10-CM

## 2019-07-30 DIAGNOSIS — R000 Tachycardia, unspecified: Secondary | ICD-10-CM | POA: Diagnosis not present

## 2019-08-04 DIAGNOSIS — Z1159 Encounter for screening for other viral diseases: Secondary | ICD-10-CM | POA: Diagnosis not present

## 2019-08-04 DIAGNOSIS — G894 Chronic pain syndrome: Secondary | ICD-10-CM | POA: Diagnosis not present

## 2019-08-04 DIAGNOSIS — Z79899 Other long term (current) drug therapy: Secondary | ICD-10-CM | POA: Diagnosis not present

## 2019-08-04 DIAGNOSIS — M47816 Spondylosis without myelopathy or radiculopathy, lumbar region: Secondary | ICD-10-CM | POA: Diagnosis not present

## 2019-08-06 DIAGNOSIS — I10 Essential (primary) hypertension: Secondary | ICD-10-CM | POA: Diagnosis not present

## 2019-08-19 ENCOUNTER — Ambulatory Visit: Payer: PPO | Admitting: Cardiology

## 2019-08-23 DIAGNOSIS — R002 Palpitations: Secondary | ICD-10-CM | POA: Diagnosis not present

## 2019-08-23 DIAGNOSIS — R000 Tachycardia, unspecified: Secondary | ICD-10-CM | POA: Diagnosis not present

## 2019-09-02 DIAGNOSIS — G894 Chronic pain syndrome: Secondary | ICD-10-CM | POA: Diagnosis not present

## 2019-09-02 DIAGNOSIS — M961 Postlaminectomy syndrome, not elsewhere classified: Secondary | ICD-10-CM | POA: Diagnosis not present

## 2019-09-02 DIAGNOSIS — Z79899 Other long term (current) drug therapy: Secondary | ICD-10-CM | POA: Diagnosis not present

## 2019-09-03 ENCOUNTER — Other Ambulatory Visit: Payer: Self-pay | Admitting: Family Medicine

## 2019-09-03 DIAGNOSIS — Z1231 Encounter for screening mammogram for malignant neoplasm of breast: Secondary | ICD-10-CM

## 2019-09-05 ENCOUNTER — Ambulatory Visit: Payer: PPO

## 2019-09-12 ENCOUNTER — Other Ambulatory Visit: Payer: Self-pay

## 2019-09-12 ENCOUNTER — Encounter: Payer: Self-pay | Admitting: Cardiology

## 2019-09-12 ENCOUNTER — Ambulatory Visit (INDEPENDENT_AMBULATORY_CARE_PROVIDER_SITE_OTHER): Payer: PPO | Admitting: Cardiology

## 2019-09-12 VITALS — BP 139/88 | HR 104 | Temp 97.7°F | Ht 63.0 in | Wt 123.0 lb

## 2019-09-12 DIAGNOSIS — I471 Supraventricular tachycardia, unspecified: Secondary | ICD-10-CM | POA: Insufficient documentation

## 2019-09-12 DIAGNOSIS — Z716 Tobacco abuse counseling: Secondary | ICD-10-CM

## 2019-09-12 DIAGNOSIS — R002 Palpitations: Secondary | ICD-10-CM | POA: Diagnosis not present

## 2019-09-12 DIAGNOSIS — Z712 Person consulting for explanation of examination or test findings: Secondary | ICD-10-CM | POA: Diagnosis not present

## 2019-09-12 DIAGNOSIS — I10 Essential (primary) hypertension: Secondary | ICD-10-CM | POA: Diagnosis not present

## 2019-09-12 DIAGNOSIS — Z720 Tobacco use: Secondary | ICD-10-CM | POA: Diagnosis not present

## 2019-09-12 DIAGNOSIS — Z7189 Other specified counseling: Secondary | ICD-10-CM

## 2019-09-12 NOTE — Patient Instructions (Signed)
Medication Instructions: No changes *If you need a refill on your cardiac medications before your next appointment, please call your pharmacy*  Lab Work: none at this time  Testing/Procedures: none  Follow-Up: At [facility], you and your health needs are our priority.  As part of our continuing mission to provide you with exceptional heart care, we have created designated Provider Care Teams.  These Care Teams include your primary Cardiologist (physician) and Advanced Practice Providers (APPs -  Physician Assistants and Nurse Practitioners) who all work together to provide you with the care you need, when you need it.  Your next appointment:   12 month(s)  The format for your next appointment:   Either In Person or Virtual  Provider:   Buford Dresser, MD  Other Instructions

## 2019-09-12 NOTE — Progress Notes (Signed)
Cardiology Office Note:    Date:  09/12/2019   ID:  Renee Oliver, DOB Apr 23, 1956, MRN [id number]  PCP:  Joycelyn Rua, MD  Cardiologist:  Jodelle Red, MD  Referring MD: Joycelyn Rua, MD   CC: follow up  History of Present Illness:    Renee Oliver is a 63 y.o. female with a hx of HTN who is seen for follow up. She was initially seen 07/09/19 as a new consult at the request of Joycelyn Rua, MD for the evaluation and management of palpitations.  Cardiac history: Reports heart racing with certain medications, up to 120 bpm, without associated symptoms. Palpitations first noted end of June or July 2020, was having an elevated BP and elevated HR as well. Was on bisoprolol before, HR in 70s. More recently HR has been 90s-120s. 04/16/19 visit had bisoprolol stopped, started on losartan (which has been increased from 25 mg to 100 mg) along with HCTZ. Did not wean bisoprolol, was on 10 mg daily. Had an episode of tachycardia up to 130 bpm with a generic inhaler, was told not use this anymore. Breathing has gotten worse since stopping the bisoprolol.  Today: Reviewed results of monitor together. Had several brief episodes of SVT, averaging just more than 1 per day. Interestingly, these events did not correlate with her symptoms. We reviewed SVT, discussed what it is. Noted that some atrial arrhythmias are related to lung disease, which she is at risk for given her smoking. She is motivated to quit today. Planning to get hypnotized to quit smoking.   She has been feeling better since her PCP started a new medication. I did not have this on her med list, but through [facility] it appears that this is metoprolol succinate 25 mg. We reviewed that this is also used to manage atrial arrhythmias, which may or may not be part of why she is feeling better.  Denies chest pain, shortness of breath at rest or with normal exertion. No PND, orthopnea, LE edema or unexpected weight gain. No syncope or  palpitations.  Past Medical History:  Diagnosis Date  . Anxiety   . Asthma   . Chronic back pain   . COPD (chronic obstructive pulmonary disease) (HCC)   . DDD (degenerative disc disease), lumbar   . Depression   . Hypertension     Past Surgical History:  Procedure Laterality Date  . BACK SURGERY    . BILATERAL SALPINGECTOMY    . CHOLECYSTECTOMY    . DIAGNOSTIC LAPAROSCOPY    . DILATATION & CURETTAGE/HYSTEROSCOPY WITH MYOSURE N/A 11/29/2016   Procedure: DILATATION & CURETTAGE/HYSTEROSCOPY;  Surgeon: Geryl Rankins, MD;  Location: [facility];  Service: Gynecology;  Laterality: N/A;  Ultrasound Guidance  . PARTIAL HYSTERECTOMY    . RIGHT OOPHORECTOMY    . TONSILLECTOMY      Current Medications: Current Outpatient Medications on File Prior to Visit  Medication Sig  . budesonide-formoterol (SYMBICORT) 160-4.5 MCG/ACT inhaler Inhale 2 puffs into the lungs 2 (two) times daily.  . DULoxetine (CYMBALTA) 60 MG capsule Take 60 mg by mouth 2 (two) times daily.  . hydrochlorothiazide (HYDRODIURIL) 12.5 MG tablet Take 12.5 mg by mouth daily.  Marland Kitchen loperamide (IMODIUM A-D) 2 MG capsule Take 2 mg by mouth as needed for diarrhea or loose stools.  Marland Kitchen losartan (COZAAR) 100 MG tablet Take 100 mg by mouth daily.  . metoprolol succinate (TOPROL-XL) 25 MG 24 hr tablet Take 25 mg by mouth daily.  Marland Kitchen oxyCODONE-acetaminophen (PERCOCET) 10-325 MG tablet Take  1 tablet by mouth 3 (three) times daily.  . pramipexole (MIRAPEX) 0.25 MG tablet Take 0.25 mg by mouth at bedtime.   No current facility-administered medications on file prior to visit.     Allergies:   Amoxicillin, Aspirin, Nsaids, Prednisone, and Iodinated diagnostic agents   Social History   Tobacco Use  . Smoking status: Current Every Day Smoker    Packs/day: 1.00    Years: 42.00    Pack years: 42.00    Types: Cigarettes  . Smokeless tobacco: Never Used  Substance Use Topics  . Alcohol use: Yes  . Drug use: No    Family History: father  in his early 17s had 4V CABG. Mother had HTN.  ROS:   Please see the history of present illness.  Additional pertinent ROS: Constitutional: Negative for chills, fever, night sweats, unintentional weight loss  HENT: Negative for ear pain and hearing loss.   Eyes: Negative for loss of vision and eye pain.  Respiratory: Negative for cough, sputum, wheezing.   Cardiovascular: See HPI. Gastrointestinal: Negative for abdominal pain, melena, and hematochezia.  Genitourinary: Negative for dysuria and hematuria.  Musculoskeletal: Negative for falls and myalgias.  Skin: Negative for itching and rash.  Neurological: Negative for focal weakness, focal sensory changes and loss of consciousness.  Endo/Heme/Allergies: Does not bruise/bleed easily.   EKGs/Labs/Other Studies Reviewed:    The following studies were reviewed today: Monitor 09/08/19 ~14 days of data recorded on Zio monitor. Patient had a min HR of 59 bpm, max HR of 240 bpm, and avg HR of 89 bpm. Predominant underlying rhythm was Sinus Rhythm. No VT, atrial fibrillation, high degree block, or pauses noted. There were 17 SVT events, longest of 14 beats (average rate 183 bmp), fastest lasting 7 beats (240 bpm). Isolated atrial and ventricular ectopy was rare (<1%). There was 1 triggered event, which was sinus rhythm with a PAC.  Echo 07/11/19  1. Left ventricular ejection fraction, by visual estimation, is 60 to 65%. The left ventricle has normal function. Normal left ventricular size. There is mildly increased left ventricular hypertrophy.  2. Global right ventricle has normal systolic function.The right ventricular size is normal. No increase in right ventricular wall thickness.  3. Left atrial size was normal.  4. Right atrial size was normal.  5. The mitral valve is normal in structure. No evidence of mitral valve regurgitation. No evidence of mitral stenosis.  6. The tricuspid valve is normal in structure. Tricuspid valve regurgitation was  not visualized by color flow Doppler.  7. The aortic valve is tricuspid Aortic valve regurgitation was not visualized by color flow Doppler. Structurally normal aortic valve, with no evidence of sclerosis or stenosis.  8. The pulmonic valve was normal in structure. Pulmonic valve regurgitation is not visualized by color flow Doppler.  9. TR signal is inadequate for assessing pulmonary artery systolic pressure. 10. The inferior vena cava is normal in size with greater than 50% respiratory variability, suggesting right atrial pressure of 3 mmHg. 11. The interatrial septum appears to be lipomatous.  EKG:  EKG is personally reviewed.  The ekg ordered previously demonstrates NSR  Recent Labs: No results found for requested labs within last 8760 hours.  Recent Lipid Panel No results found for: CHOL, TRIG, HDL, CHOLHDL, VLDL, LDLCALC, LDLDIRECT  Physical Exam:    VS:  BP 139/88   Pulse (!) 104   Temp 97.7 F (36.5 C)   Ht 5\' 3"  (1.6 m)   Wt 123 lb (55.8 kg)  SpO2 95%   BMI 21.79 kg/m     Wt Readings from Last 3 Encounters:  09/12/19 123 lb (55.8 kg)  07/09/19 123 lb 12.8 oz (56.2 kg)  11/29/16 122 lb (55.3 kg)    GEN: Well nourished, well developed in no acute distress HEENT: Normal, moist mucous membranes NECK: No JVD CARDIAC: regular rhythm, normal S1 and S2, no rubs or gallops. No murmur. VASCULAR: Radial and DP pulses 2+ bilaterally. No carotid bruits RESPIRATORY:  Clear to auscultation, though distant and mildly prolonged expiratory phase ABDOMEN: Soft, non-tender, non-distended MUSCULOSKELETAL:  Ambulates independently SKIN: Warm and dry, no edema NEUROLOGIC:  Alert and oriented x 3. No focal neuro deficits noted. PSYCHIATRIC:  Normal affect   ASSESSMENT:    1. Encounter to discuss test results   2. Tobacco abuse   3. Tobacco abuse counseling   4. Palpitation   5. Cardiac risk counseling   6. Counseling on health promotion and disease prevention   7. Essential  hypertension   8. Paroxysmal SVT (supraventricular tachycardia) (HCC)    PLAN:    Palpitations: reviewed monitor today. Symptoms were sinus rhythm, but had occasional asymptomatic short runs of paroxysmal SVT -already started on metoprolol succinate 25 mg daily, feeling better -counseled on red flag warning signs that nee  HTN: went up on losartan from 25 to 50 to 100 mg, started HCTZ, and then felt best when metoprolol succinate 25 mg daily.  -BP near goal today, continue to monitor with her PCP  Tobacco abuse counseling: The patient was counseled on tobacco cessation today for 4 minutes.  Counseling included reviewing the risks of smoking tobacco products, how it impacts the patient's current medical diagnoses and different strategies for quitting.  Pharmacotherapy to aid in tobacco cessation was not prescribed today. Plans to pursue hypnosis  Cardiac risk counseling and prevention recommendations: -recommend heart healthy/Mediterranean diet, with whole grains, fruits, vegetable, fish, lean meats, nuts, and olive oil. Limit salt. -recommend moderate walking, 3-5 times/week for 30-50 minutes each session. Aim for at least 150 minutes.week. Goal should be pace of 3 miles/hours, or walking 1.5 miles in 30 minutes -recommend avoidance of tobacco products. Avoid excess alcohol.  Plan for follow up: 1 year or sooner PRN  Medication Adjustments/Labs and Tests Ordered: Current medicines are reviewed at length with the patient today.  Concerns regarding medicines are outlined above.  No orders of the defined types were placed in this encounter.  No orders of the defined types were placed in this encounter.   Patient Instructions  Medication Instructions: No changes *If you need a refill on your cardiac medications before your next appointment, please call your pharmacy*  Lab Work: none at this time  Testing/Procedures: none  Follow-Up: At [facility], you and your health needs are  our priority.  As part of our continuing mission to provide you with exceptional heart care, we have created designated Provider Care Teams.  These Care Teams include your primary Cardiologist (physician) and Advanced Practice Providers (APPs -  Physician Assistants and Nurse Practitioners) who all work together to provide you with the care you need, when you need it.  Your next appointment:   12 month(s)  The format for your next appointment:   Either In Person or Virtual  Provider:   Jodelle RedBridgette Mihcael Ledee, MD  Other Instructions    Signed, Jodelle RedBridgette Jakylan Ron, MD PhD 09/12/2019  [facility]

## 2019-10-01 DIAGNOSIS — Z79899 Other long term (current) drug therapy: Secondary | ICD-10-CM | POA: Diagnosis not present

## 2019-10-01 DIAGNOSIS — M961 Postlaminectomy syndrome, not elsewhere classified: Secondary | ICD-10-CM | POA: Diagnosis not present

## 2019-10-01 DIAGNOSIS — G894 Chronic pain syndrome: Secondary | ICD-10-CM | POA: Diagnosis not present

## 2019-10-17 DIAGNOSIS — H43313 Vitreous membranes and strands, bilateral: Secondary | ICD-10-CM | POA: Diagnosis not present

## 2019-10-17 DIAGNOSIS — H2513 Age-related nuclear cataract, bilateral: Secondary | ICD-10-CM | POA: Diagnosis not present

## 2019-10-30 DIAGNOSIS — M961 Postlaminectomy syndrome, not elsewhere classified: Secondary | ICD-10-CM | POA: Diagnosis not present

## 2019-10-30 DIAGNOSIS — Z79899 Other long term (current) drug therapy: Secondary | ICD-10-CM | POA: Diagnosis not present

## 2019-10-30 DIAGNOSIS — G894 Chronic pain syndrome: Secondary | ICD-10-CM | POA: Diagnosis not present

## 2019-11-28 DIAGNOSIS — Z79899 Other long term (current) drug therapy: Secondary | ICD-10-CM | POA: Diagnosis not present

## 2019-11-28 DIAGNOSIS — M961 Postlaminectomy syndrome, not elsewhere classified: Secondary | ICD-10-CM | POA: Diagnosis not present

## 2019-11-28 DIAGNOSIS — G894 Chronic pain syndrome: Secondary | ICD-10-CM | POA: Diagnosis not present

## 2019-12-12 ENCOUNTER — Ambulatory Visit: Payer: PPO

## 2019-12-25 DIAGNOSIS — G894 Chronic pain syndrome: Secondary | ICD-10-CM | POA: Diagnosis not present

## 2019-12-25 DIAGNOSIS — Z79899 Other long term (current) drug therapy: Secondary | ICD-10-CM | POA: Diagnosis not present

## 2019-12-25 DIAGNOSIS — G8929 Other chronic pain: Secondary | ICD-10-CM | POA: Diagnosis not present

## 2019-12-25 DIAGNOSIS — Z1159 Encounter for screening for other viral diseases: Secondary | ICD-10-CM | POA: Diagnosis not present

## 2019-12-25 DIAGNOSIS — M545 Low back pain: Secondary | ICD-10-CM | POA: Diagnosis not present

## 2020-01-26 DIAGNOSIS — G894 Chronic pain syndrome: Secondary | ICD-10-CM | POA: Diagnosis not present

## 2020-01-26 DIAGNOSIS — M961 Postlaminectomy syndrome, not elsewhere classified: Secondary | ICD-10-CM | POA: Diagnosis not present

## 2020-01-26 DIAGNOSIS — Z79899 Other long term (current) drug therapy: Secondary | ICD-10-CM | POA: Diagnosis not present

## 2020-01-28 ENCOUNTER — Other Ambulatory Visit: Payer: Self-pay | Admitting: Nurse Practitioner

## 2020-01-28 ENCOUNTER — Ambulatory Visit
Admission: RE | Admit: 2020-01-28 | Discharge: 2020-01-28 | Disposition: A | Discharge status: Home / Self Care | Payer: PPO | Source: Ambulatory Visit | Attending: Family Medicine | Admitting: Family Medicine

## 2020-01-28 ENCOUNTER — Other Ambulatory Visit: Payer: Self-pay

## 2020-01-28 DIAGNOSIS — Z1231 Encounter for screening mammogram for malignant neoplasm of breast: Secondary | ICD-10-CM

## 2020-11-17 ENCOUNTER — Encounter: Payer: Self-pay | Admitting: Cardiology

## 2021-07-02 DIAGNOSIS — U071 COVID-19: Secondary | ICD-10-CM

## 2021-07-02 HISTORY — DX: COVID-19: U07.1

## 2021-07-19 ENCOUNTER — Emergency Department (HOSPITAL_COMMUNITY): Payer: Medicare Other

## 2021-07-19 ENCOUNTER — Other Ambulatory Visit: Payer: Self-pay

## 2021-07-19 ENCOUNTER — Inpatient Hospital Stay (HOSPITAL_COMMUNITY)
Admission: EM | Admit: 2021-07-19 | Discharge: 2021-07-24 | DRG: 177 | Disposition: A | Discharge status: Home / Self Care | Payer: Medicare Other | Attending: Internal Medicine | Admitting: Internal Medicine

## 2021-07-19 ENCOUNTER — Encounter (HOSPITAL_COMMUNITY): Payer: Self-pay

## 2021-07-19 DIAGNOSIS — J9602 Acute respiratory failure with hypercapnia: Secondary | ICD-10-CM | POA: Diagnosis present

## 2021-07-19 DIAGNOSIS — Z7951 Long term (current) use of inhaled steroids: Secondary | ICD-10-CM | POA: Diagnosis not present

## 2021-07-19 DIAGNOSIS — Z91041 Radiographic dye allergy status: Secondary | ICD-10-CM

## 2021-07-19 DIAGNOSIS — Z886 Allergy status to analgesic agent status: Secondary | ICD-10-CM | POA: Diagnosis not present

## 2021-07-19 DIAGNOSIS — U071 COVID-19: Secondary | ICD-10-CM | POA: Diagnosis not present

## 2021-07-19 DIAGNOSIS — E876 Hypokalemia: Secondary | ICD-10-CM | POA: Diagnosis present

## 2021-07-19 DIAGNOSIS — R6510 Systemic inflammatory response syndrome (SIRS) of non-infectious origin without acute organ dysfunction: Secondary | ICD-10-CM

## 2021-07-19 DIAGNOSIS — I10 Essential (primary) hypertension: Secondary | ICD-10-CM | POA: Diagnosis present

## 2021-07-19 DIAGNOSIS — Z880 Allergy status to penicillin: Secondary | ICD-10-CM

## 2021-07-19 DIAGNOSIS — Z888 Allergy status to other drugs, medicaments and biological substances status: Secondary | ICD-10-CM

## 2021-07-19 DIAGNOSIS — E871 Hypo-osmolality and hyponatremia: Secondary | ICD-10-CM | POA: Diagnosis present

## 2021-07-19 DIAGNOSIS — J440 Chronic obstructive pulmonary disease with acute lower respiratory infection: Secondary | ICD-10-CM | POA: Diagnosis present

## 2021-07-19 DIAGNOSIS — R0902 Hypoxemia: Secondary | ICD-10-CM

## 2021-07-19 DIAGNOSIS — J9601 Acute respiratory failure with hypoxia: Secondary | ICD-10-CM | POA: Diagnosis present

## 2021-07-19 DIAGNOSIS — Z79891 Long term (current) use of opiate analgesic: Secondary | ICD-10-CM

## 2021-07-19 DIAGNOSIS — F17210 Nicotine dependence, cigarettes, uncomplicated: Secondary | ICD-10-CM | POA: Diagnosis present

## 2021-07-19 DIAGNOSIS — R002 Palpitations: Secondary | ICD-10-CM | POA: Diagnosis present

## 2021-07-19 DIAGNOSIS — Z79899 Other long term (current) drug therapy: Secondary | ICD-10-CM

## 2021-07-19 DIAGNOSIS — J1282 Pneumonia due to coronavirus disease 2019: Secondary | ICD-10-CM | POA: Diagnosis present

## 2021-07-19 DIAGNOSIS — G894 Chronic pain syndrome: Secondary | ICD-10-CM | POA: Diagnosis present

## 2021-07-19 DIAGNOSIS — E872 Acidosis, unspecified: Secondary | ICD-10-CM | POA: Diagnosis present

## 2021-07-19 DIAGNOSIS — J441 Chronic obstructive pulmonary disease with (acute) exacerbation: Secondary | ICD-10-CM | POA: Diagnosis present

## 2021-07-19 DIAGNOSIS — F32A Depression, unspecified: Secondary | ICD-10-CM | POA: Diagnosis present

## 2021-07-19 DIAGNOSIS — J189 Pneumonia, unspecified organism: Secondary | ICD-10-CM | POA: Diagnosis present

## 2021-07-19 LAB — COMPREHENSIVE METABOLIC PANEL
ALT: 42 U/L (ref 0–44)
AST: 36 U/L (ref 15–41)
Albumin: 3 g/dL — ABNORMAL LOW (ref 3.5–5.0)
Alkaline Phosphatase: 135 U/L — ABNORMAL HIGH (ref 38–126)
Anion gap: 11 (ref 5–15)
BUN: 22 mg/dL (ref 8–23)
CO2: 29 mmol/L (ref 22–32)
Calcium: 8.5 mg/dL — ABNORMAL LOW (ref 8.9–10.3)
Chloride: 92 mmol/L — ABNORMAL LOW (ref 98–111)
Creatinine, Ser: 0.91 mg/dL (ref 0.44–1.00)
GFR, Estimated: >60 mL/min (ref >60)
Glucose, Bld: 112 mg/dL — ABNORMAL HIGH (ref 70–99)
Potassium: 3.1 mmol/L — ABNORMAL LOW (ref 3.5–5.1)
Sodium: 132 mmol/L — ABNORMAL LOW (ref 135–145)
Total Bilirubin: 0.7 mg/dL (ref 0.3–1.2)
Total Protein: 7.1 g/dL (ref 6.5–8.1)

## 2021-07-19 LAB — D-DIMER, QUANTITATIVE: D-Dimer, Quant: 1.06 ug/mL-FEU — ABNORMAL HIGH (ref 0.00–0.50)

## 2021-07-19 LAB — CBC
HCT: 46.6 % — ABNORMAL HIGH (ref 36.0–46.0)
Hemoglobin: 15.5 g/dL — ABNORMAL HIGH (ref 12.0–15.0)
MCH: 35.9 pg — ABNORMAL HIGH (ref 26.0–34.0)
MCHC: 33.3 g/dL (ref 30.0–36.0)
MCV: 107.9 fL — ABNORMAL HIGH (ref 80.0–100.0)
Platelets: 252 10*3/uL (ref 150–400)
RBC: 4.32 MIL/uL (ref 3.87–5.11)
RDW: 12.3 % (ref 11.5–15.5)
WBC: 12.4 10*3/uL — ABNORMAL HIGH (ref 4.0–10.5)
nRBC: 0 % (ref 0.0–0.2)

## 2021-07-19 LAB — CBC WITH DIFFERENTIAL/PLATELET
Abs Immature Granulocytes: 0.14 10*3/uL — ABNORMAL HIGH (ref 0.00–0.07)
Basophils Absolute: 0 10*3/uL (ref 0.0–0.1)
Basophils Relative: 0 %
Eosinophils Absolute: 0 10*3/uL (ref 0.0–0.5)
Eosinophils Relative: 0 %
HCT: 48.8 % — ABNORMAL HIGH (ref 36.0–46.0)
Hemoglobin: 16.8 g/dL — ABNORMAL HIGH (ref 12.0–15.0)
Immature Granulocytes: 1 %
Lymphocytes Relative: 8 %
Lymphs Abs: 1 10*3/uL (ref 0.7–4.0)
MCH: 35.8 pg — ABNORMAL HIGH (ref 26.0–34.0)
MCHC: 34.4 g/dL (ref 30.0–36.0)
MCV: 104.1 fL — ABNORMAL HIGH (ref 80.0–100.0)
Monocytes Absolute: 2.6 10*3/uL — ABNORMAL HIGH (ref 0.1–1.0)
Monocytes Relative: 21 %
Neutro Abs: 8.7 10*3/uL — ABNORMAL HIGH (ref 1.7–7.7)
Neutrophils Relative %: 70 %
Platelets: 310 10*3/uL (ref 150–400)
RBC: 4.69 MIL/uL (ref 3.87–5.11)
RDW: 12.2 % (ref 11.5–15.5)
WBC: 12.5 10*3/uL — ABNORMAL HIGH (ref 4.0–10.5)
nRBC: 0 % (ref 0.0–0.2)

## 2021-07-19 LAB — PROCALCITONIN: Procalcitonin: <0.1 ng/mL

## 2021-07-19 LAB — CREATININE, SERUM
Creatinine, Ser: 0.75 mg/dL (ref 0.44–1.00)
GFR, Estimated: >60 mL/min (ref >60)

## 2021-07-19 LAB — BLOOD GAS, ARTERIAL
Acid-base deficit: 0.7 mmol/L (ref 0.0–2.0)
Bicarbonate: 23.9 mmol/L (ref 20.0–28.0)
FIO2: 32
O2 Saturation: 94.9 %
Patient temperature: 36.7
pCO2 arterial: 37.5 mmHg (ref 32.0–48.0)
pH, Arterial: 7.41 (ref 7.350–7.450)
pO2, Arterial: 83.8 mmHg (ref 83.0–108.0)

## 2021-07-19 LAB — LACTIC ACID, PLASMA
Lactic Acid, Venous: 1.4 mmol/L (ref 0.5–1.9)
Lactic Acid, Venous: 3.1 mmol/L (ref 0.5–1.9)
Lactic Acid, Venous: 1.2 mmol/L (ref 0.5–1.9)
Lactic Acid, Venous: 1.1 mmol/L (ref 0.5–1.9)

## 2021-07-19 LAB — APTT: aPTT: 28 seconds (ref 24–36)

## 2021-07-19 LAB — PROTIME-INR
INR: 1 (ref 0.8–1.2)
Prothrombin Time: 13.3 seconds (ref 11.4–15.2)

## 2021-07-19 LAB — RESP PANEL BY RT-PCR (FLU A&B, COVID) ARPGX2
Influenza A by PCR: NEGATIVE
Influenza B by PCR: NEGATIVE
SARS Coronavirus 2 by RT PCR: POSITIVE — AB

## 2021-07-19 LAB — C-REACTIVE PROTEIN: CRP: 30.3 mg/dL — ABNORMAL HIGH (ref <1.0)

## 2021-07-19 LAB — BRAIN NATRIURETIC PEPTIDE: B Natriuretic Peptide: 308 pg/mL — ABNORMAL HIGH (ref 0.0–100.0)

## 2021-07-19 LAB — SEDIMENTATION RATE: Sed Rate: 46 mm/hr — ABNORMAL HIGH (ref 0–22)

## 2021-07-19 MED ORDER — NICOTINE 14 MG/24HR TD PT24
14.0000 mg | MEDICATED_PATCH | Freq: Every day | TRANSDERMAL | Status: DC
  Administered 2021-07-19 – 2021-07-24 (×6): 14 mg via TRANSDERMAL
  Filled 2021-07-19 (×6): qty 1

## 2021-07-19 MED ORDER — HYDROMORPHONE HCL 1 MG/ML IJ SOLN: 0.5000 mg | INTRAMUSCULAR | Status: DC | PRN

## 2021-07-19 MED ORDER — ACETAMINOPHEN 650 MG RE SUPP: 650.0000 mg | Freq: Four times a day (QID) | RECTAL | Status: DC | PRN

## 2021-07-19 MED ORDER — METHYLPREDNISOLONE SODIUM SUCC 125 MG IJ SOLR
80.0000 mg | Freq: Two times a day (BID) | INTRAMUSCULAR | Status: DC
  Administered 2021-07-20 – 2021-07-22 (×5): 80 mg via INTRAVENOUS
  Filled 2021-07-19 (×5): qty 2

## 2021-07-19 MED ORDER — LACTATED RINGERS IV BOLUS (SEPSIS): 250.0000 mL | Freq: Once | INTRAVENOUS | Status: DC

## 2021-07-19 MED ORDER — AZITHROMYCIN 500 MG IV SOLR
500.0000 mg | INTRAVENOUS | Status: DC
Start: 2021-07-19 — End: 2021-07-20
  Administered 2021-07-19: 500 mg via INTRAVENOUS
  Filled 2021-07-19: qty 500

## 2021-07-19 MED ORDER — POTASSIUM CHLORIDE CRYS ER 20 MEQ PO TBCR
40.0000 meq | EXTENDED_RELEASE_TABLET | Freq: Once | ORAL | Status: AC
  Administered 2021-07-19: 40 meq via ORAL
  Filled 2021-07-19: qty 2

## 2021-07-19 MED ORDER — OXYCODONE-ACETAMINOPHEN 10-325 MG PO TABS: 1.0000 | ORAL_TABLET | Freq: Three times a day (TID) | ORAL | Status: DC | PRN

## 2021-07-19 MED ORDER — OXYCODONE HCL 5 MG PO TABS
5.0000 mg | ORAL_TABLET | ORAL | Status: DC | PRN
Start: 2021-07-19 — End: 2021-07-19

## 2021-07-19 MED ORDER — HYDRALAZINE HCL 20 MG/ML IJ SOLN
10.0000 mg | INTRAMUSCULAR | Status: DC | PRN
  Administered 2021-07-21: 10 mg via INTRAVENOUS
  Filled 2021-07-19: qty 1

## 2021-07-19 MED ORDER — ALBUTEROL SULFATE (2.5 MG/3ML) 0.083% IN NEBU
2.5000 mg | INHALATION_SOLUTION | Freq: Once | RESPIRATORY_TRACT | Status: AC
  Administered 2021-07-19: 2.5 mg via RESPIRATORY_TRACT
  Filled 2021-07-19: qty 3

## 2021-07-19 MED ORDER — METHYLPREDNISOLONE SODIUM SUCC 125 MG IJ SOLR
125.0000 mg | Freq: Once | INTRAMUSCULAR | Status: AC
  Administered 2021-07-19: 125 mg via INTRAVENOUS
  Filled 2021-07-19: qty 2

## 2021-07-19 MED ORDER — OXYCODONE-ACETAMINOPHEN 5-325 MG PO TABS: 1.0000 | ORAL_TABLET | Freq: Three times a day (TID) | ORAL | Status: DC | PRN

## 2021-07-19 MED ORDER — SODIUM CHLORIDE 0.9 % IV SOLN: 1.0000 g | INTRAVENOUS | Status: DC

## 2021-07-19 MED ORDER — SODIUM CHLORIDE 0.9% FLUSH
3.0000 mL | Freq: Two times a day (BID) | INTRAVENOUS | Status: DC
  Administered 2021-07-19 – 2021-07-23 (×7): 3 mL via INTRAVENOUS

## 2021-07-19 MED ORDER — METOPROLOL TARTRATE 5 MG/5ML IV SOLN
5.0000 mg | INTRAVENOUS | Status: DC | PRN
  Administered 2021-07-20: 5 mg via INTRAVENOUS
  Filled 2021-07-19: qty 5

## 2021-07-19 MED ORDER — ONDANSETRON HCL 4 MG/2ML IJ SOLN: 4.0000 mg | Freq: Four times a day (QID) | INTRAMUSCULAR | Status: DC | PRN

## 2021-07-19 MED ORDER — LEVOFLOXACIN IN D5W 750 MG/150ML IV SOLN
750.0000 mg | Freq: Once | INTRAVENOUS | Status: DC
Start: 2021-07-20 — End: 2021-07-19

## 2021-07-19 MED ORDER — ACETAMINOPHEN 325 MG PO TABS: 650.0000 mg | ORAL_TABLET | Freq: Four times a day (QID) | ORAL | Status: DC | PRN

## 2021-07-19 MED ORDER — DULOXETINE HCL 60 MG PO CPEP
60.0000 mg | ORAL_CAPSULE | Freq: Two times a day (BID) | ORAL | Status: DC
  Administered 2021-07-19 – 2021-07-24 (×11): 60 mg via ORAL
  Filled 2021-07-19: qty 2
  Filled 2021-07-19 (×7): qty 1
  Filled 2021-07-19 (×2): qty 2
  Filled 2021-07-19: qty 1

## 2021-07-19 MED ORDER — LACTATED RINGERS IV BOLUS (SEPSIS)
1000.0000 mL | Freq: Once | INTRAVENOUS | Status: DC
  Administered 2021-07-19: 1000 mL via INTRAVENOUS

## 2021-07-19 MED ORDER — ONDANSETRON HCL 4 MG PO TABS: 4.0000 mg | ORAL_TABLET | Freq: Four times a day (QID) | ORAL | Status: DC | PRN

## 2021-07-19 MED ORDER — SODIUM CHLORIDE 0.9 % IV SOLN
100.0000 mg | Freq: Every day | INTRAVENOUS | Status: AC
  Administered 2021-07-21 – 2021-07-23 (×3): 100 mg via INTRAVENOUS
  Filled 2021-07-19 (×4): qty 20

## 2021-07-19 MED ORDER — BISACODYL 5 MG PO TBEC: 5.0000 mg | DELAYED_RELEASE_TABLET | Freq: Every day | ORAL | Status: DC | PRN

## 2021-07-19 MED ORDER — TRAZODONE HCL 50 MG PO TABS
25.0000 mg | ORAL_TABLET | Freq: Every evening | ORAL | Status: DC | PRN
  Administered 2021-07-21 – 2021-07-22 (×2): 25 mg via ORAL
  Filled 2021-07-19 (×3): qty 1

## 2021-07-19 MED ORDER — MORPHINE SULFATE ER 30 MG PO TBCR
30.0000 mg | EXTENDED_RELEASE_TABLET | Freq: Two times a day (BID) | ORAL | Status: DC
Start: 2021-07-19 — End: 2021-07-24
  Administered 2021-07-19 – 2021-07-24 (×11): 30 mg via ORAL
  Filled 2021-07-19 (×11): qty 1

## 2021-07-19 MED ORDER — ALBUTEROL SULFATE HFA 108 (90 BASE) MCG/ACT IN AERS
2.0000 | INHALATION_SPRAY | RESPIRATORY_TRACT | Status: DC
  Administered 2021-07-19 – 2021-07-22 (×20): 2 via RESPIRATORY_TRACT
  Filled 2021-07-19 (×2): qty 6.7

## 2021-07-19 MED ORDER — LACTATED RINGERS IV SOLN: INTRAVENOUS | Status: DC

## 2021-07-19 MED ORDER — ZINC SULFATE 220 (50 ZN) MG PO CAPS
220.0000 mg | ORAL_CAPSULE | Freq: Every day | ORAL | Status: AC
  Administered 2021-07-19 – 2021-07-23 (×5): 220 mg via ORAL
  Filled 2021-07-19 (×5): qty 1

## 2021-07-19 MED ORDER — SODIUM CHLORIDE 0.9 % IV SOLN: 250.0000 mL | INTRAVENOUS | Status: DC | PRN

## 2021-07-19 MED ORDER — IPRATROPIUM-ALBUTEROL 0.5-2.5 (3) MG/3ML IN SOLN
3.0000 mL | Freq: Once | RESPIRATORY_TRACT | Status: AC
  Administered 2021-07-19: 3 mL via RESPIRATORY_TRACT
  Filled 2021-07-19: qty 3

## 2021-07-19 MED ORDER — SODIUM CHLORIDE 0.9 % IV BOLUS (SEPSIS)
1000.0000 mL | Freq: Once | INTRAVENOUS | Status: AC
  Administered 2021-07-19: 1000 mL via INTRAVENOUS

## 2021-07-19 MED ORDER — SODIUM CHLORIDE 0.9% FLUSH
3.0000 mL | Freq: Two times a day (BID) | INTRAVENOUS | Status: DC
  Administered 2021-07-19 – 2021-07-23 (×4): 3 mL via INTRAVENOUS

## 2021-07-19 MED ORDER — OXYCODONE HCL 5 MG PO TABS
5.0000 mg | ORAL_TABLET | Freq: Three times a day (TID) | ORAL | Status: DC | PRN
  Administered 2021-07-21: 5 mg via ORAL
  Filled 2021-07-19: qty 1

## 2021-07-19 MED ORDER — REMDESIVIR 100 MG IV SOLR
100.0000 mg | INTRAVENOUS | Status: AC
  Administered 2021-07-19 (×2): 100 mg via INTRAVENOUS
  Filled 2021-07-19 (×2): qty 20

## 2021-07-19 MED ORDER — OXYCODONE-ACETAMINOPHEN 10-325 MG PO TABS: 1.0000 | ORAL_TABLET | Freq: Three times a day (TID) | ORAL | Status: DC

## 2021-07-19 MED ORDER — FLUTICASONE FUROATE-VILANTEROL 200-25 MCG/ACT IN AEPB
1.0000 | INHALATION_SPRAY | Freq: Every day | RESPIRATORY_TRACT | Status: DC
  Administered 2021-07-20 – 2021-07-24 (×5): 1 via RESPIRATORY_TRACT
  Filled 2021-07-19: qty 28

## 2021-07-19 MED ORDER — METOPROLOL SUCCINATE ER 25 MG PO TB24: 25.0000 mg | ORAL_TABLET | Freq: Every day | ORAL | Status: DC

## 2021-07-19 MED ORDER — SENNOSIDES-DOCUSATE SODIUM 8.6-50 MG PO TABS: 1.0000 | ORAL_TABLET | Freq: Every evening | ORAL | Status: DC | PRN

## 2021-07-19 MED ORDER — IPRATROPIUM BROMIDE 0.02 % IN SOLN: 0.5000 mg | Freq: Four times a day (QID) | RESPIRATORY_TRACT | Status: DC | PRN

## 2021-07-19 MED ORDER — HEPARIN SODIUM (PORCINE) 5000 UNIT/ML IJ SOLN
5000.0000 [IU] | Freq: Three times a day (TID) | INTRAMUSCULAR | Status: DC
  Administered 2021-07-19 – 2021-07-24 (×15): 5000 [IU] via SUBCUTANEOUS
  Filled 2021-07-19 (×15): qty 1

## 2021-07-19 MED ORDER — SODIUM CHLORIDE 0.9 % IV BOLUS (SEPSIS)
250.0000 mL | Freq: Once | INTRAVENOUS | Status: AC
  Administered 2021-07-19: 250 mL via INTRAVENOUS

## 2021-07-19 MED ORDER — GUAIFENESIN-DM 100-10 MG/5ML PO SYRP
10.0000 mL | ORAL_SOLUTION | Freq: Three times a day (TID) | ORAL | Status: DC
  Administered 2021-07-19 – 2021-07-20 (×2): 10 mL via ORAL
  Filled 2021-07-19 (×11): qty 10

## 2021-07-19 MED ORDER — ASCORBIC ACID 500 MG PO TABS
500.0000 mg | ORAL_TABLET | Freq: Every day | ORAL | Status: AC
  Administered 2021-07-19 – 2021-07-23 (×5): 500 mg via ORAL
  Filled 2021-07-19 (×5): qty 1

## 2021-07-19 MED ORDER — METOPROLOL SUCCINATE ER 50 MG PO TB24
50.0000 mg | ORAL_TABLET | Freq: Every day | ORAL | Status: DC
  Administered 2021-07-19 – 2021-07-24 (×6): 50 mg via ORAL
  Filled 2021-07-19 (×6): qty 1

## 2021-07-19 MED ORDER — SODIUM CHLORIDE 0.9% FLUSH: 3.0000 mL | INTRAVENOUS | Status: DC | PRN

## 2021-07-19 MED ORDER — SODIUM CHLORIDE 0.9 % IV SOLN: INTRAVENOUS | Status: DC

## 2021-07-19 MED ORDER — LEVALBUTEROL HCL 0.63 MG/3ML IN NEBU: 0.6300 mg | INHALATION_SOLUTION | Freq: Four times a day (QID) | RESPIRATORY_TRACT | Status: DC | PRN

## 2021-07-19 MED ORDER — HYDROCOD POLST-CPM POLST ER 10-8 MG/5ML PO SUER
5.0000 mL | Freq: Two times a day (BID) | ORAL | Status: DC
  Administered 2021-07-19 – 2021-07-24 (×10): 5 mL via ORAL
  Filled 2021-07-19 (×10): qty 5

## 2021-07-19 MED ORDER — LACTATED RINGERS IV BOLUS (SEPSIS): 500.0000 mL | Freq: Once | INTRAVENOUS | Status: DC

## 2021-07-19 MED ORDER — SODIUM CHLORIDE 0.9 % IV BOLUS (SEPSIS)
500.0000 mL | Freq: Once | INTRAVENOUS | Status: AC
  Administered 2021-07-19: 500 mL via INTRAVENOUS

## 2021-07-19 NOTE — ED Notes (Signed)
Respiratory called for albuterol & duo

## 2021-07-19 NOTE — ED Notes (Signed)
Pt placed on pur wick

## 2021-07-19 NOTE — ED Notes (Signed)
Changed and cleaned pt. She had a small runny BM. Put pt on a new pure wick.

## 2021-07-19 NOTE — Sepsis Progress Note (Signed)
eLink is monitoring this Code Sepsis. °

## 2021-07-19 NOTE — ED Triage Notes (Signed)
BIB [facility] for a 'sick call', pt from home, feeling bad for ~6 days, A&O x 4. Brought in on CPAP and replaced to non rebreather. Weakness and SOB.

## 2021-07-19 NOTE — Progress Notes (Signed)
**Note De-Identified Loza Prell Obfuscation** Patient removed from BIPAP and placed on 2 L  by RN; tolerating well. RRT to continue to monitor.

## 2021-07-19 NOTE — ED Provider Notes (Signed)
[facility] EMERGENCY DEPARTMENT Provider Note   CSN: [id number] Arrival date & time: 07/19/21  1242     History Chief Complaint  Patient presents with   Code Sepsis    Renee Oliver is a 65 y.o. female.  Patient has been feeling poorly for the last few days.  She has a history of COPD and has had a cough.  Patient smokes about a pack a day.  She complains of shortness of breath.  When paramedics arrived she had an oxygen sat of about 87%  The history is provided by the patient. No language interpreter was used.  Shortness of Breath Severity:  Moderate Onset quality:  Gradual Duration:  6 days Timing:  Constant Progression:  Waxing and waning Chronicity:  New Context: activity   Relieved by:  Nothing Worsened by:  Nothing Ineffective treatments:  None tried Associated symptoms: no abdominal pain, no chest pain, no cough, no headaches and no rash       Past Medical History:  Diagnosis Date   Anxiety    Asthma    Chronic back pain    COPD (chronic obstructive pulmonary disease) (HCC)    DDD (degenerative disc disease), lumbar    Depression    Hypertension     Patient Active Problem List   Diagnosis Date Noted   SIRS (systemic inflammatory response syndrome) (HCC) 07/19/2021   PNA (pneumonia) 07/19/2021    Class: Acute   Acute respiratory failure with hypoxia and hypercapnia (HCC) 07/19/2021    Class: Acute   Palpitation 09/12/2019   Essential hypertension 09/12/2019   Paroxysmal SVT (supraventricular tachycardia) (HCC) 09/12/2019    Past Surgical History:  Procedure Laterality Date   BACK SURGERY     BILATERAL SALPINGECTOMY     CHOLECYSTECTOMY     DIAGNOSTIC LAPAROSCOPY     DILATATION & CURETTAGE/HYSTEROSCOPY WITH MYOSURE N/A 11/29/2016   Procedure: DILATATION & CURETTAGE/HYSTEROSCOPY;  Surgeon: Geryl Rankins, MD;  Location: [facility];  Service: Gynecology;  Laterality: N/A;  Ultrasound Guidance   PARTIAL HYSTERECTOMY     RIGHT OOPHORECTOMY      TONSILLECTOMY       OB History   No obstetric history on file.     History reviewed. No pertinent family history.  Social History   Tobacco Use   Smoking status: Every Day    Packs/day: 1.00    Years: 42.00    Pack years: 42.00    Types: Cigarettes   Smokeless tobacco: Never  Substance Use Topics   Alcohol use: Yes   Drug use: No    Home Medications Prior to Admission medications   Medication Sig Start Date End Date Taking? Authorizing Provider  baclofen (LIORESAL) 10 MG tablet Take 10 mg by mouth 3 (three) times daily as needed. 06/28/21   [provider]  budesonide-formoterol (SYMBICORT) 160-4.5 MCG/ACT inhaler Inhale 2 puffs into the lungs 2 (two) times daily.    [provider]  DULoxetine (CYMBALTA) 60 MG capsule Take 60 mg by mouth 2 (two) times daily.    [provider]  hydrochlorothiazide (HYDRODIURIL) 12.5 MG tablet Take 12.5 mg by mouth daily.    [provider]  loperamide (IMODIUM A-D) 2 MG capsule Take 2 mg by mouth as needed for diarrhea or loose stools.    [provider]  losartan (COZAAR) 100 MG tablet Take 100 mg by mouth daily.    [provider]  metoprolol succinate (TOPROL-XL) 25 MG 24 hr tablet Take 25 mg by mouth  daily.    [provider]  metoprolol succinate (TOPROL-XL) 50 MG 24 hr tablet Take 50 mg by mouth daily. 04/24/21   [provider]  morphine (MS CONTIN) 30 MG 12 hr tablet Take 30 mg by mouth 2 (two) times daily. 07/06/21   [provider]  oxyCODONE-acetaminophen (PERCOCET) 10-325 MG tablet Take 1 tablet by mouth 3 (three) times daily.    [provider]  pramipexole (MIRAPEX) 0.25 MG tablet Take 0.25 mg by mouth at bedtime.    [provider]    Allergies    Amoxicillin, Aspirin, Nsaids, Prednisone, and Iodinated diagnostic agents  Review of Systems   Review of Systems  Constitutional:  Positive for fatigue. Negative for appetite change.   HENT:  Negative for congestion, ear discharge and sinus pressure.   Eyes:  Negative for discharge.  Respiratory:  Positive for shortness of breath. Negative for cough.   Cardiovascular:  Negative for chest pain.  Gastrointestinal:  Negative for abdominal pain and diarrhea.  Genitourinary:  Negative for frequency and hematuria.  Musculoskeletal:  Negative for back pain.  Skin:  Negative for rash.  Neurological:  Negative for seizures and headaches.  Psychiatric/Behavioral:  Negative for hallucinations.    Physical Exam Updated Vital Signs BP 116/68   Pulse (!) 112   Temp 98.1 F (36.7 C) (Oral)   Resp 18   Ht 5\' 2"  (1.575 m)   Wt 54.4 kg   SpO2 96%   BMI 21.95 kg/m   Physical Exam Vitals and nursing note reviewed.  Constitutional:      Appearance: She is well-developed.  HENT:     Head: Normocephalic.     Nose: Nose normal.  Eyes:     General: No scleral icterus.    Conjunctiva/sclera: Conjunctivae normal.  Neck:     Thyroid: No thyromegaly.  Cardiovascular:     Rate and Rhythm: Regular rhythm. Tachycardia present.     Heart sounds: No murmur heard.   No friction rub. No gallop.  Pulmonary:     Breath sounds: No stridor. Rhonchi present. No wheezing or rales.  Chest:     Chest wall: No tenderness.  Abdominal:     General: There is no distension.     Tenderness: There is no abdominal tenderness. There is no rebound.  Musculoskeletal:        General: Normal range of motion.     Cervical back: Neck supple.  Lymphadenopathy:     Cervical: No cervical adenopathy.  Skin:    Findings: No erythema or rash.  Neurological:     Mental Status: She is alert and oriented to person, place, and time.     Motor: No abnormal muscle tone.     Coordination: Coordination normal.  Psychiatric:        Behavior: Behavior normal.    ED Results / Procedures / Treatments   Labs (all labs ordered are listed, but only abnormal results are displayed) Labs Reviewed  COMPREHENSIVE  METABOLIC PANEL - Abnormal; Notable for the following components:      Result Value   Sodium 132 (*)    Potassium 3.1 (*)    Chloride 92 (*)    Glucose, Bld 112 (*)    Calcium 8.5 (*)    Albumin 3.0 (*)    Alkaline Phosphatase 135 (*)    All other components within normal limits  CBC WITH DIFFERENTIAL/PLATELET - Abnormal; Notable for the following components:   WBC 12.5 (*)    Hemoglobin  16.8 (*)    HCT 48.8 (*)    MCV 104.1 (*)    MCH 35.8 (*)    Neutro Abs 8.7 (*)    Monocytes Absolute 2.6 (*)    Abs Immature Granulocytes 0.14 (*)    All other components within normal limits  CULTURE, BLOOD (ROUTINE X 2)  CULTURE, BLOOD (ROUTINE X 2)  RESP PANEL BY RT-PCR (FLU A&B, COVID) ARPGX2  URINE CULTURE  CULTURE, BLOOD (ROUTINE X 2)  CULTURE, BLOOD (ROUTINE X 2)  EXPECTORATED SPUTUM ASSESSMENT W GRAM STAIN, RFLX TO RESP C  MRSA NEXT GEN BY PCR, NASAL  LACTIC ACID, PLASMA  PROTIME-INR  APTT  LACTIC ACID, PLASMA  URINALYSIS, ROUTINE W REFLEX MICROSCOPIC  CBC  CREATININE, SERUM  HIV ANTIBODY (ROUTINE TESTING W REFLEX)  BRAIN NATRIURETIC PEPTIDE  STREP PNEUMONIAE URINARY ANTIGEN  BLOOD GAS, ARTERIAL    EKG None  Radiology DG Chest Port 1 View  Result Date: 07/19/2021 CLINICAL DATA:  Possible sepsis. EXAM: PORTABLE CHEST 1 VIEW COMPARISON:  Chest x-ray dated September 10, 2009. FINDINGS: The heart size and mediastinal contours are within normal limits. The lungs are hyperinflated. New coarse reticulonodular interstitial opacities at the lung bases. No focal consolidation, pleural effusion, or pneumothorax. No acute osseous abnormality. IMPRESSION: 1. New coarse reticulonodular interstitial opacities at the lung bases which may reflect atypical infection or smoking-related interstitial lung disease. Electronically Signed   By: Obie Dredge M.D.   On: 07/19/2021 13:54    Procedures Procedures   Medications Ordered in ED Medications  lactated ringers infusion ( Intravenous  New Bag/Given 07/19/21 1326)  azithromycin (ZITHROMAX) 500 mg in sodium chloride 0.9 % 250 mL IVPB (500 mg Intravenous New Bag/Given 07/19/21 1319)  heparin injection 5,000 Units (has no administration in time range)  sodium chloride flush (NS) 0.9 % injection 3 mL (3 mLs Intravenous Not Given 07/19/21 1519)  sodium chloride flush (NS) 0.9 % injection 3 mL (has no administration in time range)  sodium chloride flush (NS) 0.9 % injection 3 mL (has no administration in time range)  0.9 %  sodium chloride infusion (has no administration in time range)  acetaminophen (TYLENOL) tablet 650 mg (has no administration in time range)    Or  acetaminophen (TYLENOL) suppository 650 mg (has no administration in time range)  HYDROmorphone (DILAUDID) injection 0.5-1 mg (has no administration in time range)  traZODone (DESYREL) tablet 25 mg (has no administration in time range)  senna-docusate (Senokot-S) tablet 1 tablet (has no administration in time range)  bisacodyl (DULCOLAX) EC tablet 5 mg (has no administration in time range)  ondansetron (ZOFRAN) tablet 4 mg (has no administration in time range)    Or  ondansetron (ZOFRAN) injection 4 mg (has no administration in time range)  ipratropium (ATROVENT) nebulizer solution 0.5 mg (has no administration in time range)  levalbuterol (XOPENEX) nebulizer solution 0.63 mg (has no administration in time range)  hydrALAZINE (APRESOLINE) injection 10 mg (has no administration in time range)  lactated ringers bolus 1,000 mL (has no administration in time range)    And  lactated ringers bolus 500 mL (has no administration in time range)    And  lactated ringers bolus 250 mL (has no administration in time range)  DULoxetine (CYMBALTA) DR capsule 60 mg (has no administration in time range)  fluticasone furoate-vilanterol (BREO ELLIPTA) 200-25 MCG/ACT 1 puff (1 puff Inhalation Not Given 07/19/21 1503)  methylPREDNISolone sodium succinate (SOLU-MEDROL) 125 mg/2 mL  injection 125 mg (has no administration in  time range)  methylPREDNISolone sodium succinate (SOLU-MEDROL) 125 mg/2 mL injection 80 mg (has no administration in time range)  cefTRIAXone (ROCEPHIN) 1 g in sodium chloride 0.9 % 100 mL IVPB (has no administration in time range)  oxyCODONE-acetaminophen (PERCOCET/ROXICET) 5-325 MG per tablet 1 tablet (has no administration in time range)    And  oxyCODONE (Oxy IR/ROXICODONE) immediate release tablet 5 mg (has no administration in time range)  morphine (MS CONTIN) 12 hr tablet 30 mg (has no administration in time range)  metoprolol succinate (TOPROL-XL) 24 hr tablet 50 mg (has no administration in time range)  sodium chloride 0.9 % bolus 1,000 mL (0 mLs Intravenous Stopped 07/19/21 1414)    And  sodium chloride 0.9 % bolus 500 mL (0 mLs Intravenous Stopped 07/19/21 1441)    And  sodium chloride 0.9 % bolus 250 mL (250 mLs Intravenous New Bag/Given 07/19/21 1441)  ipratropium-albuterol (DUONEB) 0.5-2.5 (3) MG/3ML nebulizer solution 3 mL (3 mLs Nebulization Given 07/19/21 1446)  albuterol (PROVENTIL) (2.5 MG/3ML) 0.083% nebulizer solution 2.5 mg (2.5 mg Nebulization Given 07/19/21 1447)  potassium chloride SA (KLOR-CON) CR tablet 40 mEq (40 mEq Oral Given 07/19/21 1442)    ED Course  I have reviewed the triage vital signs and the nursing notes.  Pertinent labs & imaging results that were available during my care of the patient were reviewed by me and considered in my medical decision making (see chart for details).   CRITICAL CARE Performed by: Bethann Berkshire Total critical care time: 40 minutes Critical care time was exclusive of separately billable procedures and treating other patients. Critical care was necessary to treat or prevent imminent or life-threatening deterioration. Critical care was time spent personally by me on the following activities: development of treatment plan with patient and/or surrogate as well as nursing, discussions  with consultants, evaluation of patient's response to treatment, examination of patient, obtaining history from patient or surrogate, ordering and performing treatments and interventions, ordering and review of laboratory studies, ordering and review of radiographic studies, pulse oximetry and re-evaluation of patient's condition.  MDM Rules/Calculators/A&P                           Patient with hypoxia and COPD exacerbation along with possible pneumonia.  She will be admitted for IV antibiotics and fluids and neb treatments. Final Clinical Impression(s) / ED Diagnoses Final diagnoses:  Hypoxia    Rx / DC Orders ED Discharge Orders     None        Bethann Berkshire, MD 07/19/21 1523

## 2021-07-19 NOTE — H&P (Signed)
History and Physical   Patient: Renee Oliver                            PCP: Mitzi Hansen, NP                    DOB: Jun 29, 1956            DOA: 07/19/2021 UXN:[id number]             DOS: 07/19/2021, 4:05 PM  Mitzi Hansen, NP  Patient coming from:   HOME  I have personally reviewed patient's medical records, in electronic medical records, including:  [facility], and care everywhere.    Chief Complaint:   Chief Complaint  Patient presents with   Code Sepsis    History of present illness:    Renee Oliver is a 65 y.o. female with medical history significant of with history of COPD, depression, hypertension anxiety and smoker presents ED with chief complaint of shortness of breath, and palpitation.  Reporting of nonproductive cough for past 6 days days, progressing getting worse.   Patient Denies having: Fever, Chills, Cough, SOB, Chest Pain, Abd pain, N/V/D, headache, dizziness, lightheadedness,  Dysuria, Joint pain, rash, open wounds  ED Course:   Vitals:   07/19/21 1447 07/19/21 1500  BP:  132/71  Pulse:  (!) 120  Resp:  (!) 23  Temp:    SpO2: 96% 93%   labs; sodium 132, potassium 3.1, WBC 12.5, per EMS she was satting 87% on room air, on 3 L of oxygen, satting 96%  Chest x-ray showed opacity at lower lobes possible infection, pneumonia. SARS-CoV-2 positive Influenza A/B negative   Review of Systems: As per HPI, otherwise 10 point review of systems were negative.   ----------------------------------------------------------------------------------------------------------------------  Allergies  Allergen Reactions   Amoxicillin Nausea And Vomiting    Can take penicillin but Amoxicillin makes her have upset stomach   Aspirin Nausea And Vomiting and Other (See Comments)    Heart palpations    Nsaids Nausea And Vomiting   Prednisone Other (See Comments)    Hyper  Mental changes   Iodinated Diagnostic Agents Swelling and Rash    Throat swelling     Home MEDs:  Prior to Admission medications   Medication Sig Start Date End Date Taking? Authorizing Provider  baclofen (LIORESAL) 10 MG tablet Take 10 mg by mouth 3 (three) times daily as needed. 06/28/21   [provider]  budesonide-formoterol (SYMBICORT) 160-4.5 MCG/ACT inhaler Inhale 2 puffs into the lungs 2 (two) times daily.    [provider]  DULoxetine (CYMBALTA) 60 MG capsule Take 60 mg by mouth 2 (two) times daily.    [provider]  hydrochlorothiazide (HYDRODIURIL) 12.5 MG tablet Take 12.5 mg by mouth daily.    [provider]  loperamide (IMODIUM A-D) 2 MG capsule Take 2 mg by mouth as needed for diarrhea or loose stools.    [provider]  losartan (COZAAR) 100 MG tablet Take 100 mg by mouth daily.    [provider]  metoprolol succinate (TOPROL-XL) 25 MG 24 hr tablet Take 25 mg by mouth daily.    [provider]  metoprolol succinate (TOPROL-XL) 50 MG 24 hr tablet Take 50 mg by mouth daily. 04/24/21   [provider]  morphine (MS CONTIN) 30 MG 12 hr tablet Take 30 mg by mouth 2 (two) times daily. 07/06/21   [provider]  oxyCODONE-acetaminophen (PERCOCET) 10-325 MG tablet Take 1 tablet by mouth 3 (three) times daily.    [provider]  pramipexole (MIRAPEX) 0.25 MG tablet Take 0.25 mg by mouth at bedtime.    [provider]    PRN MEDs: sodium chloride, acetaminophen **OR** acetaminophen, bisacodyl, hydrALAZINE, HYDROmorphone (DILAUDID) injection, ipratropium, levalbuterol, ondansetron **OR** ondansetron (ZOFRAN) IV, oxyCODONE-acetaminophen **AND** oxyCODONE, senna-docusate, sodium chloride flush, traZODone  Past Medical History:  Diagnosis Date   Anxiety    Asthma    Chronic back pain    COPD (chronic obstructive pulmonary disease) (HCC)    DDD (degenerative disc disease), lumbar    Depression    Hypertension     Past Surgical History:  Procedure Laterality Date    BACK SURGERY     BILATERAL SALPINGECTOMY     CHOLECYSTECTOMY     DIAGNOSTIC LAPAROSCOPY     DILATATION & CURETTAGE/HYSTEROSCOPY WITH MYOSURE N/A 11/29/2016   Procedure: DILATATION & CURETTAGE/HYSTEROSCOPY;  Surgeon: Geryl Rankins, MD;  Location: [facility];  Service: Gynecology;  Laterality: N/A;  Ultrasound Guidance   PARTIAL HYSTERECTOMY     RIGHT OOPHORECTOMY     TONSILLECTOMY       reports that she has been smoking cigarettes. She has a 42.00 pack-year smoking history. She has never used smokeless tobacco. She reports current alcohol use. She reports that she does not use drugs.   History reviewed. No pertinent family history.  Physical Exam:   Vitals:   07/19/21 1409 07/19/21 1430 07/19/21 1447 07/19/21 1500  BP: 118/76 116/68  132/71  Pulse: (!) 112 (!) 112  (!) 120  Resp: 18 18  (!) 23  Temp:      TempSrc:      SpO2: 100% 96% 96% 93%  Weight:      Height:       Constitutional: NAD, calm, comfortable Eyes: PERRL, lids and conjunctivae normal ENMT: Mucous membranes are moist. Posterior pharynx clear of any exudate or lesions.Normal dentition.  Neck: normal, supple, no masses, no thyromegaly Respiratory: clear to auscultation bilaterally, no wheezing, no crackles. Normal respiratory effort. No accessory muscle use.  Cardiovascular: Regular rate and rhythm, no murmurs / rubs / gallops. No extremity edema. 2+ pedal pulses. No carotid bruits.  Abdomen: no tenderness, no masses palpated. No hepatosplenomegaly. Bowel sounds positive.  Musculoskeletal: no clubbing / cyanosis. No joint deformity upper and lower extremities. Good ROM, no contractures. Normal muscle tone.  Neurologic: CN II-XII grossly intact. Sensation intact, DTR normal. Strength 5/5 in all 4.  Psychiatric: Normal judgment and insight. Alert and oriented x 3. Normal mood.  Skin: no rashes, lesions, ulcers. No induration Decubitus/ulcers:  Wounds: per nursing documentation         Labs on admission:    I  have personally reviewed following labs and imaging studies  CBC: Recent Labs  Lab 07/19/21 1305 07/19/21 1526  WBC 12.5* 12.4*  NEUTROABS 8.7*  --   HGB 16.8* 15.5*  HCT 48.8* 46.6*  MCV 104.1* 107.9*  PLT 310 252   Basic Metabolic Panel: Recent Labs  Lab 07/19/21 1305  NA 132*  K 3.1*  CL 92*  CO2 29  GLUCOSE 112*  BUN 22  CREATININE 0.91  CALCIUM 8.5*   GFR: Estimated Creatinine Clearance: 48.7 mL/min (by C-G formula based on SCr of 0.91 mg/dL). Liver Function Tests: Recent Labs  Lab 07/19/21 1305  AST 36  ALT 42  ALKPHOS 135*  BILITOT 0.7  PROT 7.1  ALBUMIN 3.0*   No results  for input(s): LIPASE, AMYLASE in the last 168 hours. No results for input(s): AMMONIA in the last 168 hours. Coagulation Profile: Recent Labs  Lab 07/19/21 1305  INR 1.0   Cardiac Enzymes: No results for input(s): CKTOTAL, CKMB, CKMBINDEX, TROPONINI in the last 168 hours. BNP (last 3 results) No results for input(s): PROBNP in the last 8760 hours. HbA1C: No results for input(s): HGBA1C in the last 72 hours. CBG: No results for input(s): GLUCAP in the last 168 hours. Lipid Profile: No results for input(s): CHOL, HDL, LDLCALC, TRIG, CHOLHDL, LDLDIRECT in the last 72 hours. Thyroid Function Tests: No results for input(s): TSH, T4TOTAL, FREET4, T3FREE, THYROIDAB in the last 72 hours. Anemia Panel: No results for input(s): VITAMINB12, FOLATE, FERRITIN, TIBC, IRON, RETICCTPCT in the last 72 hours. Urine analysis: No results found for: COLORURINE, APPEARANCEUR, LABSPEC, PHURINE, GLUCOSEU, HGBUR, BILIRUBINUR, KETONESUR, PROTEINUR, UROBILINOGEN, NITRITE, LEUKOCYTESUR   Radiologic Exams on Admission:   DG Chest Port 1 View  Result Date: 07/19/2021 CLINICAL DATA:  Possible sepsis. EXAM: PORTABLE CHEST 1 VIEW COMPARISON:  Chest x-ray dated September 10, 2009. FINDINGS: The heart size and mediastinal contours are within normal limits. The lungs are hyperinflated. New coarse  reticulonodular interstitial opacities at the lung bases. No focal consolidation, pleural effusion, or pneumothorax. No acute osseous abnormality. IMPRESSION: 1. New coarse reticulonodular interstitial opacities at the lung bases which may reflect atypical infection or smoking-related interstitial lung disease. Electronically Signed   By: Obie Dredge M.D.   On: 07/19/2021 13:54    EKG:   Independently reviewed.  Orders placed or performed during the hospital encounter of 07/19/21   ED EKG   ED EKG   EKG 12-Lead   EKG 12-Lead   ED EKG 12-Lead   ED EKG 12-Lead   EKG 12-Lead   ---------------------------------------------------------------------------------------------------------------------------------------    Assessment / Plan:   Principal Problem:   SIRS (systemic inflammatory response syndrome) (HCC) Active Problems:   Palpitation   Essential hypertension   PNA (pneumonia)   COVID-19 virus infection   Acute respiratory failure with hypoxia and hypercapnia (HCC)   Principal Problem:  SARS-CoV-2 infection -pneumonia versus bacterial pneumonia -SARS-CoV-2 positive, influenza A/B negative -We will follow with inflammatory markers -Continue O2 supplements currently satting 96% on 3 L of oxygen -Initiated IV steroids -Initiating IV remdesivir -Vitamin C, zinc supplements  SIRS (systemic inflammatory response syndrome) (HCC)-likely due to SARS-CoV-2 infection -Ruling out sepsis, currently meeting SIRS criteria with tachycardia HR 112, WBC 12.5, Currently afebrile normotensive, satting 96% on 3 L of oxygen -Lactic acid 1.4, procalcitonin -  -We will follow with a blood cultures -We will try to obtain sputum cultures -Per sepsis protocol, initiating IV fluid resuscitation,  -broad-spectrum antibiotics azithromycin, Rocephin -procalcitonin normal we will discontinue antibiotics in 24 hours -We will admit to telemetry and monitor patient closely    Acute respiratory  failure with hypoxia and hypercapnia (HCC)-COVID infection -Likely due to COVID pneumonia, possible underlying history of COPD -Currently requiring 3 L of oxygen, satting 96% -DuoNeb bronchodilator treatments -We will initiate mucolytic's, IV steroids 125 mg IV x1 then 80 twice daily --- with quick taper -Treat underlying cause possible pneumonia  Tachycardia -  -Heart rate currently 112, treating unknown cause of SIRS, ruling out sepsis, treating pneumonia, acute respiratory distress -We will try DuoNeb bronchodilators with Xopenex and Atrovent, try to avoid albuterol As it may worsen her tachycardia -Has a history of SVT, will monitor electrolytes replace potassium, check magnesium -We will continue home medication of Toprol-XL 50  mg   Hypokalemia -We will replete orally with 40 mEq     Essential hypertension -We will holding home medication of HCTZ, losartan... In anticipation of sepsis, hypotension   ?  History of COPD -Home medication of Symbicort will be continued along with DuoNeb bronchodilator treatment Supplemental oxygen  Depression -Stable continue Cymbalta    Cultures:  -Blood cultures x2 >>> Antimicrobial: -IV azithromycin, Rocephin -IV remdesivir >>>.  Consults called:  None -------------------------------------------------------------------------------------------------------------------------------------------- DVT prophylaxis: SCD/Compression stockings and Heparin SQ Code Status:   Code Status: Full Code   Admission status: Patient will be admitted as Inpatient, with a greater than 2 midnight length of stay. Level of care: Telemetry   Family Communication:  none at bedside  (The above findings and plan of care has been discussed with patient in detail, the patient expressed understanding and agreement of above plan)   --------------------------------------------------------------------------------------------------------------------------------------------------  Disposition Plan: >3 days Status is: Inpatient  Remains inpatient appropriate because: Requiring inpatient hospitalization due to hypoxia, likely due to pneumonia, needing IV antibiotics, IV steroids, breathing treatments    ---------------------------------------------------------------------------------------------------------------------------------  Time spent: > than  55  Min.   SIGNED: Kendell Bane, MD, FHM. [facility],  Pager (Please use [email or web address] to page to text)  If 7PM-7AM, please contact night-coverage [email or web address],  07/19/2021, 4:05 PM

## 2021-07-19 NOTE — ED Notes (Signed)
Pt purwick dislodged d/t continuous movement. Bedding at patient changed. Purwick readjusted and patient educated on movement. Pt states she feels comfortable and dry at this time.

## 2021-07-20 DIAGNOSIS — R6510 Systemic inflammatory response syndrome (SIRS) of non-infectious origin without acute organ dysfunction: Secondary | ICD-10-CM | POA: Diagnosis not present

## 2021-07-20 LAB — C-REACTIVE PROTEIN: CRP: 22.1 mg/dL — ABNORMAL HIGH (ref <1.0)

## 2021-07-20 LAB — CBC
HCT: 40.5 % (ref 36.0–46.0)
Hemoglobin: 13.8 g/dL (ref 12.0–15.0)
MCH: 36 pg — ABNORMAL HIGH (ref 26.0–34.0)
MCHC: 34.1 g/dL (ref 30.0–36.0)
MCV: 105.7 fL — ABNORMAL HIGH (ref 80.0–100.0)
Platelets: 301 10*3/uL (ref 150–400)
RBC: 3.83 MIL/uL — ABNORMAL LOW (ref 3.87–5.11)
RDW: 12.4 % (ref 11.5–15.5)
WBC: 8.6 10*3/uL (ref 4.0–10.5)
nRBC: 0 % (ref 0.0–0.2)

## 2021-07-20 LAB — BASIC METABOLIC PANEL
Anion gap: 8 (ref 5–15)
BUN: 15 mg/dL (ref 8–23)
CO2: 23 mmol/L (ref 22–32)
Calcium: 8 mg/dL — ABNORMAL LOW (ref 8.9–10.3)
Chloride: 104 mmol/L (ref 98–111)
Creatinine, Ser: 0.53 mg/dL (ref 0.44–1.00)
GFR, Estimated: >60 mL/min (ref >60)
Glucose, Bld: 88 mg/dL (ref 70–99)
Potassium: 2.8 mmol/L — ABNORMAL LOW (ref 3.5–5.1)
Sodium: 135 mmol/L (ref 135–145)

## 2021-07-20 LAB — MRSA NEXT GEN BY PCR, NASAL: MRSA by PCR Next Gen: NOT DETECTED

## 2021-07-20 LAB — PROTIME-INR
INR: 1.1 (ref 0.8–1.2)
Prothrombin Time: 14.1 seconds (ref 11.4–15.2)

## 2021-07-20 LAB — MAGNESIUM: Magnesium: 1.7 mg/dL (ref 1.7–2.4)

## 2021-07-20 LAB — CBG MONITORING, ED: Glucose-Capillary: 117 mg/dL — ABNORMAL HIGH (ref 70–99)

## 2021-07-20 LAB — HIV ANTIBODY (ROUTINE TESTING W REFLEX): HIV Screen 4th Generation wRfx: NONREACTIVE

## 2021-07-20 MED ORDER — POTASSIUM CHLORIDE 10 MEQ/100ML IV SOLN
10.0000 meq | INTRAVENOUS | Status: AC
  Administered 2021-07-20 (×3): 10 meq via INTRAVENOUS
  Filled 2021-07-20 (×2): qty 100

## 2021-07-20 MED ORDER — OXYCODONE HCL 5 MG PO TABS
5.0000 mg | ORAL_TABLET | Freq: Three times a day (TID) | ORAL | Status: DC
  Administered 2021-07-20 – 2021-07-21 (×3): 5 mg via ORAL
  Filled 2021-07-20 (×3): qty 1

## 2021-07-20 MED ORDER — POTASSIUM CHLORIDE CRYS ER 20 MEQ PO TBCR
40.0000 meq | EXTENDED_RELEASE_TABLET | Freq: Two times a day (BID) | ORAL | Status: DC
  Administered 2021-07-20 – 2021-07-23 (×7): 40 meq via ORAL
  Filled 2021-07-20 (×2): qty 4
  Filled 2021-07-20: qty 2
  Filled 2021-07-20: qty 4
  Filled 2021-07-20: qty 2
  Filled 2021-07-20: qty 4
  Filled 2021-07-20 (×3): qty 2

## 2021-07-20 MED ORDER — OXYCODONE-ACETAMINOPHEN 10-325 MG PO TABS: 1.0000 | ORAL_TABLET | Freq: Three times a day (TID) | ORAL | Status: DC

## 2021-07-20 MED ORDER — OXYCODONE-ACETAMINOPHEN 5-325 MG PO TABS
1.0000 | ORAL_TABLET | Freq: Three times a day (TID) | ORAL | Status: DC
  Administered 2021-07-20 – 2021-07-21 (×3): 1 via ORAL
  Filled 2021-07-20 (×3): qty 1

## 2021-07-20 MED ORDER — LIDOCAINE VISCOUS HCL 2 % MT SOLN
5.0000 mL | Freq: Four times a day (QID) | OROMUCOSAL | Status: DC | PRN
  Administered 2021-07-20: 5 mL via OROMUCOSAL
  Filled 2021-07-20: qty 15

## 2021-07-20 MED ORDER — BACLOFEN 10 MG PO TABS: 10.0000 mg | ORAL_TABLET | Freq: Three times a day (TID) | ORAL | Status: DC | PRN

## 2021-07-20 MED ORDER — POTASSIUM CHLORIDE 10 MEQ/100ML IV SOLN
INTRAVENOUS | Status: AC
  Administered 2021-07-20: 10 meq
  Filled 2021-07-20: qty 100

## 2021-07-20 MED ORDER — MAGIC MOUTHWASH
5.0000 mL | Freq: Four times a day (QID) | ORAL | Status: DC | PRN
  Administered 2021-07-20 – 2021-07-23 (×5): 5 mL via ORAL
  Filled 2021-07-20 (×5): qty 5

## 2021-07-20 MED ORDER — MAGIC MOUTHWASH W/LIDOCAINE: 5.0000 mL | Freq: Four times a day (QID) | ORAL | Status: DC | PRN

## 2021-07-20 NOTE — TOC Initial Note (Signed)
Transition of Care [facility]) - Initial/Assessment Note    Patient Details  Name: Renee Oliver MRN: [id number] Date of Birth: [birth year]  Transition of Care [facility]) CM/SW Contact:    Armanda Heritage, RN Phone Number: 07/20/2021, 12:17 PM  Clinical Narrative:  CM noted TOC consult.  Patient from home and has a primary care provider.  At this time PT/OT evals are pending and patient is currently on 3L O2.  TOC will follow for therapy recommendations and possible home oxygen needs should the patient be unable to wean to room air.  Per MD during progression anticipate medical stability for discharge in 1-2 days.                  Expected Discharge Plan: Home/Self Care Barriers to Discharge: Continued Medical Work up   Patient Goals and CMS Choice Patient states their goals for this hospitalization and ongoing recovery are:: to go home      Expected Discharge Plan and Services Expected Discharge Plan: Home/Self Care   Discharge Planning Services: CM Consult   Living arrangements for the past 2 months: Single Family Home                                      Prior Living Arrangements/Services Living arrangements for the past 2 months: Single Family Home   Patient language and need for interpreter reviewed:: Yes Do you feel safe going back to the place where you live?: Yes      Need for Family Participation in Patient Care: No (Comment) Care giver support system in place?: Yes (comment)   Criminal Activity/Legal Involvement Pertinent to Current Situation/Hospitalization: No - Comment as needed  Activities of Daily Living      Permission Sought/Granted                  Emotional Assessment Appearance:: Appears stated age         Psych Involvement: No (comment)  Admission diagnosis:  SIRS (systemic inflammatory response syndrome) (HCC) [R65.10] Patient Active Problem List   Diagnosis Date Noted   SIRS (systemic inflammatory response syndrome) (HCC)  07/19/2021   PNA (pneumonia) 07/19/2021    Class: Acute   Acute respiratory failure with hypoxia and hypercapnia (HCC) 07/19/2021    Class: Acute   COVID-19 virus infection 07/19/2021   Palpitation 09/12/2019   Essential hypertension 09/12/2019   Paroxysmal SVT (supraventricular tachycardia) (HCC) 09/12/2019   PCP:  Mitzi Hansen, NP Pharmacy:   [facility] [phone] - [place], [place] - [address] [place] [address] Phone: [phone] Fax: [phone]     Social Determinants of Health (SDOH) Interventions    Readmission Risk Interventions No flowsheet data found.

## 2021-07-20 NOTE — Evaluation (Signed)
Physical Therapy Evaluation Patient Details Name: Renee Oliver MRN: [id number] DOB: [birth year] Today's Date: 07/20/2021  History of Present Illness  Renee Oliver is a 65 y.o. female with medical history significant of with history of COPD, depression, hypertension anxiety and smoker presents ED with chief complaint of shortness of breath, and palpitation.  Reporting of nonproductive cough for past 6 days days, progressing getting worse.   Clinical Impression  Patient demonstrates slow labored movement for sitting up at bedside, once seated SpO2 dropped from 95% to 86% while on room air and put back on 2 LPM O2, very unsteady on feet and unable to maintain standing balance without Min hand held assist, required use of RW for safety and limited to a few steps forward/backwards at bedside before having to sit due to c/o fatigue and generalized weakness.  Patient put back to bed after therapy and c/o pain on right side of tounge - RN notified.  Patient will benefit from continued physical therapy in hospital and recommended venue below to increase strength, balance, endurance for safe ADLs and gait.         Recommendations for follow up therapy are one component of a multi-disciplinary discharge planning process, led by the attending physician.  Recommendations may be updated based on patient status, additional functional criteria and insurance authorization.  Follow Up Recommendations SNF;Supervision for mobility/OOB;Supervision - Intermittent    Equipment Recommendations  Rolling walker with 5" wheels    Recommendations for Other Services       Precautions / Restrictions Precautions Precautions: Fall Restrictions Weight Bearing Restrictions: No      Mobility  Bed Mobility Overal bed mobility: Needs Assistance Bed Mobility: Supine to Sit;Sit to Supine     Supine to sit: Supervision Sit to supine: Supervision   General bed mobility comments: slow labored movement     Transfers Overall transfer level: Needs assistance Equipment used: Rolling walker (2 wheeled);1 person hand held assist;None Transfers: Sit to/from Stand Sit to Stand: Min assist         General transfer comment: very unsteady on feet with loss of balance, had to use RW for safety  Ambulation/Gait Ambulation/Gait assistance: Min assist Gait Distance (Feet): 10 Feet Assistive device: Rolling walker (2 wheeled) Gait Pattern/deviations: Decreased step length - right;Decreased step length - left;Decreased stride length Gait velocity: decreased   General Gait Details: slow labored unsteady shaky movment, limited mostly due to fatigue with SpO2 dropping to 86% on room air  Stairs            Wheelchair Mobility    Modified Rankin (Stroke Patients Only)       Balance Overall balance assessment: Needs assistance Sitting-balance support: Feet supported;No upper extremity supported Sitting balance-Leahy Scale: Fair Sitting balance - Comments: seated at EOB   Standing balance support: During functional activity;No upper extremity supported Standing balance-Leahy Scale: Poor Standing balance comment: fair using RW                             Pertinent Vitals/Pain Pain Assessment: Faces Faces Pain Scale: Hurts little more Pain Location: possible blister right side of tonge Pain Descriptors / Indicators: Sore;Tender Pain Intervention(s): Limited activity within patient's tolerance;Monitored during session;Patient requesting pain meds-RN notified    Home Living Family/patient expects to be discharged to:: Private residence Living Arrangements: Spouse/significant other Available Help at Discharge: Family;Available 24 hours/day Type of Home: Mobile home Home Access: Stairs to enter Entrance Stairs-Rails: Right;Left;Can  reach both Entrance Stairs-Number of Steps: 2 Home Layout: One level Home Equipment: None      Prior Function Level of Independence:  Independent         Comments: Tourist information centre manager, drives     Higher education careers adviser        Extremity/Trunk Assessment   Upper Extremity Assessment Upper Extremity Assessment: Generalized weakness    Lower Extremity Assessment Lower Extremity Assessment: Generalized weakness    Cervical / Trunk Assessment Cervical / Trunk Assessment: Normal  Communication   Communication: No difficulties  Cognition Arousal/Alertness: Awake/alert Behavior During Therapy: WFL for tasks assessed/performed Overall Cognitive Status: Within Functional Limits for tasks assessed                                        General Comments      Exercises     Assessment/Plan    PT Assessment Patient needs continued PT services  PT Problem List Decreased strength;Decreased activity tolerance;Decreased balance;Decreased mobility       PT Treatment Interventions DME instruction;Gait training;Functional mobility training;Therapeutic activities;Stair training;Therapeutic exercise;Balance training;Patient/family education    PT Goals (Current goals can be found in the Care Plan section)  Acute Rehab PT Goals Patient Stated Goal: return home with family assist PT Goal Formulation: With patient Time For Goal Achievement: 08/03/21 Potential to Achieve Goals: Good    Frequency Min 3X/week   Barriers to discharge        Co-evaluation               AM-PAC PT "6 Clicks" Mobility  Outcome Measure Help needed turning from your back to your side while in a flat bed without using bedrails?: None Help needed moving from lying on your back to sitting on the side of a flat bed without using bedrails?: A Little Help needed moving to and from a bed to a chair (including a wheelchair)?: A Little Help needed standing up from a chair using your arms (e.g., wheelchair or bedside chair)?: A Little Help needed to walk in hospital room?: A Lot Help needed climbing 3-5 steps with a railing? : A  Lot 6 Click Score: 17    End of Session Equipment Utilized During Treatment: Oxygen Activity Tolerance: Patient tolerated treatment well;Patient limited by fatigue Patient left: in bed;with call bell/phone within reach Nurse Communication: Mobility status PT Visit Diagnosis: Unsteadiness on feet (R26.81);Other abnormalities of gait and mobility (R26.89);Muscle weakness (generalized) (M62.81)    Time: 0350-0938 PT Time Calculation (min) (ACUTE ONLY): 28 min   Charges:   PT Evaluation $PT Eval Moderate Complexity: 1 Mod PT Treatments $Therapeutic Activity: 23-37 mins        1:58 PM, 07/20/21 Ocie Bob, MPT Physical Therapist with [facility] [phone] office [phone] mobile phone

## 2021-07-20 NOTE — ED Notes (Signed)
Pt new purewick on pt old purewick soiled

## 2021-07-20 NOTE — Progress Notes (Signed)
PROGRESS NOTE    Renee Oliver  EFE:[id number] DOB: November 18, 1955 DOA: 07/19/2021 PCP: Mitzi Hansen, NP   Brief Narrative:   Renee Oliver is a 65 y.o. female with medical history significant of with history of COPD, depression, hypertension anxiety and smoker presents ED with chief complaint of shortness of breath, and palpitation.  Reporting of nonproductive cough for past 6 days days, progressing getting worse.  Patient was admitted with acute hypoxemic respiratory failure secondary to COVID-pneumonia.  She was initially started on some antibiotics due to suspicion of superimposed bacterial pneumonia, but procalcitonin level is low.  She continues to have ongoing wheezing and shortness of breath.  Assessment & Plan:   Principal Problem:   SIRS (systemic inflammatory response syndrome) (HCC) Active Problems:   Palpitation   Essential hypertension   PNA (pneumonia)   Acute respiratory failure with hypoxia and hypercapnia (HCC)   COVID-19 virus infection   Acute hypoxemic respiratory failure secondary to COVID PNA -Wean oxygen as tolerated -Continue IV steroids and IV remdesivir -Procalcitonin noted to be low, discontinue antibiotics -Continue supplements as prescribed -Trend inflammatory markers  Hypokalemia -Replete and reevaluate in a.m. -Recheck magnesium levels -Monitor on telemetry  Essential hypertension -Holding home blood pressure medications -IV hydralazine as needed  History of COPD -Continue steroids and breathing treatments as needed  Depression -Continue Cymbalta  DVT prophylaxis: Heparin Code Status: Full Family Communication: Discussed with husband on phone 10/19 Disposition Plan:  Status is: Inpatient  Remains inpatient appropriate because: Requires IV medications.   Consultants:  None  Procedures:  See below  Antimicrobials:  Anti-infectives (From admission, onward)    Start     Dose/Rate Route Frequency Ordered Stop   07/20/21 1000   remdesivir 100 mg in sodium chloride 0.9 % 100 mL IVPB        100 mg 200 mL/hr over 30 Minutes Intravenous Daily 07/19/21 1600 07/24/21 0959   07/20/21 0600  levofloxacin (LEVAQUIN) IVPB 750 mg  Status:  Discontinued        750 mg 100 mL/hr over 90 Minutes Intravenous  Once 07/19/21 1451 07/19/21 1502   07/19/21 1630  remdesivir 100 mg in sodium chloride 0.9 % 100 mL IVPB        100 mg 200 mL/hr over 30 Minutes Intravenous Every 1 hr x 2 07/19/21 1600 07/19/21 1836   07/19/21 1530  cefTRIAXone (ROCEPHIN) 1 g in sodium chloride 0.9 % 100 mL IVPB  Status:  Discontinued        1 g 200 mL/hr over 30 Minutes Intravenous Every 24 hours 07/19/21 1502 07/20/21 0737   07/19/21 1315  azithromycin (ZITHROMAX) 500 mg in sodium chloride 0.9 % 250 mL IVPB  Status:  Discontinued        500 mg 250 mL/hr over 60 Minutes Intravenous Every 24 hours 07/19/21 1309 07/20/21 0737       Subjective: Patient seen and evaluated today with ongoing shortness of breath and wheezing noted.  She requires oxygen supplementation.  Objective: Vitals:   07/20/21 0448 07/20/21 0500 07/20/21 0600 07/20/21 0700  BP:  121/75 130/75 140/77  Pulse:  74 71 74  Resp:  18 17 17   Temp:   98 F (36.7 C)   TempSrc:   Oral   SpO2: 96% 93% 93% 94%  Weight:      Height:        Intake/Output Summary (Last 24 hours) at 07/20/2021 1017 Last data filed at 07/19/2021 1836 Gross per 24 hour  Intake  3398.6 ml  Output --  Net 3398.6 ml   Filed Weights   07/19/21 1249  Weight: 54.4 kg    Examination:  General exam: Appears calm and comfortable  Respiratory system: Diffuse wheezing noted bilaterally.  Currently on 3 L nasal cannula oxygen. Cardiovascular system: S1 & S2 heard, RRR.  Gastrointestinal system: Abdomen is soft Central nervous system: Alert and awake Extremities: No edema Skin: No significant lesions noted Psychiatry: Flat affect.    Data Reviewed: I have personally reviewed following labs and imaging  studies  CBC: Recent Labs  Lab 07/19/21 1305 07/19/21 1526 07/20/21 0352  WBC 12.5* 12.4* 8.6  NEUTROABS 8.7*  --   --   HGB 16.8* 15.5* 13.8  HCT 48.8* 46.6* 40.5  MCV 104.1* 107.9* 105.7*  PLT 310 252 301   Basic Metabolic Panel: Recent Labs  Lab 07/19/21 1305 07/19/21 1526 07/20/21 0352  NA 132*  --  135  K 3.1*  --  2.8*  CL 92*  --  104  CO2 29  --  23  GLUCOSE 112*  --  88  BUN 22  --  15  CREATININE 0.91 0.75 0.53  CALCIUM 8.5*  --  8.0*  MG  --   --  1.7   GFR: Estimated Creatinine Clearance: 55.4 mL/min (by C-G formula based on SCr of 0.53 mg/dL). Liver Function Tests: Recent Labs  Lab 07/19/21 1305  AST 36  ALT 42  ALKPHOS 135*  BILITOT 0.7  PROT 7.1  ALBUMIN 3.0*   No results for input(s): LIPASE, AMYLASE in the last 168 hours. No results for input(s): AMMONIA in the last 168 hours. Coagulation Profile: Recent Labs  Lab 07/19/21 1305 07/20/21 0352  INR 1.0 1.1   Cardiac Enzymes: No results for input(s): CKTOTAL, CKMB, CKMBINDEX, TROPONINI in the last 168 hours. BNP (last 3 results) No results for input(s): PROBNP in the last 8760 hours. HbA1C: No results for input(s): HGBA1C in the last 72 hours. CBG: Recent Labs  Lab 07/20/21 0933  GLUCAP 117*   Lipid Profile: No results for input(s): CHOL, HDL, LDLCALC, TRIG, CHOLHDL, LDLDIRECT in the last 72 hours. Thyroid Function Tests: No results for input(s): TSH, T4TOTAL, FREET4, T3FREE, THYROIDAB in the last 72 hours. Anemia Panel: No results for input(s): VITAMINB12, FOLATE, FERRITIN, TIBC, IRON, RETICCTPCT in the last 72 hours. Sepsis Labs: Recent Labs  Lab 07/19/21 1301 07/19/21 1526 07/19/21 1701 07/19/21 2033  PROCALCITON  --  <0.10  --   --   LATICACIDVEN 1.4 3.1* 1.2 1.1    Recent Results (from the past 240 hour(s))  Culture, blood (Routine x 2)     Status: None (Preliminary result)   Collection Time: 07/19/21  1:16 PM   Specimen: BLOOD RIGHT FOREARM  Result Value Ref  Range Status   Specimen Description BLOOD RIGHT FOREARM  Final   Special Requests   Final    Blood Culture results may not be optimal due to an inadequate volume of blood received in culture bottles BOTTLES DRAWN AEROBIC AND ANAEROBIC   Culture   Final    NO GROWTH < 24 HOURS Performed at [facility], [address] [place], [place] [address]    Report Status PENDING  Incomplete  Culture, blood (Routine x 2)     Status: None (Preliminary result)   Collection Time: 07/19/21  1:16 PM   Specimen: BLOOD LEFT HAND  Result Value Ref Range Status   Specimen Description BLOOD LEFT HAND  Final   Special  Requests   Final    Blood Culture adequate volume BOTTLES DRAWN AEROBIC AND ANAEROBIC   Culture   Final    NO GROWTH < 24 HOURS Performed at [facility], [address] [place], [place] [address]    Report Status PENDING  Incomplete  Resp Panel by RT-PCR (Flu A&B, Covid) Nasopharyngeal Swab     Status: Abnormal   Collection Time: 07/19/21  1:20 PM   Specimen: Nasopharyngeal Swab; Nasopharyngeal(NP) swabs in vial transport medium  Result Value Ref Range Status   SARS Coronavirus 2 by RT PCR POSITIVE (A) NEGATIVE Final    Comment: WHITE,M AT 1550 ON 10.18.22 BY RUCINSKI,B (NOTE) SARS-CoV-2 target nucleic acids are DETECTED.  The SARS-CoV-2 RNA is generally detectable in upper respiratory specimens during the acute phase of infection. Positive results are indicative of the presence of the identified virus, but do not rule out bacterial infection or co-infection with other pathogens not detected by the test. Clinical correlation with patient history and other diagnostic information is necessary to determine patient infection status. The expected result is Negative.  Fact Sheet for Patients: BloggerCourse.com  Fact Sheet for Healthcare Providers: SeriousBroker.it  This test is not yet approved or cleared by the [place] FDA and   has been authorized for detection and/or diagnosis of SARS-CoV-2 by FDA under an Emergency Use Authorization (EUA).  This EUA will remain in effect (meaning this test can be used) for the duration of  the COVID-19 d eclaration under Section 564(b)(1) of the Act, 21 U.S.C. section 360bbb-3(b)(1), unless the authorization is terminated or revoked sooner.     Influenza A by PCR NEGATIVE NEGATIVE Final   Influenza B by PCR NEGATIVE NEGATIVE Final    Comment: (NOTE) The Xpert Xpress SARS-CoV-2/FLU/RSV plus assay is intended as an aid in the diagnosis of influenza from Nasopharyngeal swab specimens and should not be used as a sole basis for treatment. Nasal washings and aspirates are unacceptable for Xpert Xpress SARS-CoV-2/FLU/RSV testing.  Fact Sheet for Patients: BloggerCourse.com  Fact Sheet for Healthcare Providers: SeriousBroker.it  This test is not yet approved or cleared by the [place] FDA and has been authorized for detection and/or diagnosis of SARS-CoV-2 by FDA under an Emergency Use Authorization (EUA). This EUA will remain in effect (meaning this test can be used) for the duration of the COVID-19 declaration under Section 564(b)(1) of the Act, 21 U.S.C. section 360bbb-3(b)(1), unless the authorization is terminated or revoked.  Performed at [facility], [address], [place], [place] [address]          Radiology Studies: [facility] 1 View  Result Date: 07/19/2021 CLINICAL DATA:  Possible sepsis. EXAM: PORTABLE CHEST 1 VIEW COMPARISON:  Chest x-ray dated September 10, 2009. FINDINGS: The heart size and mediastinal contours are within normal limits. The lungs are hyperinflated. New coarse reticulonodular interstitial opacities at the lung bases. No focal consolidation, pleural effusion, or pneumothorax. No acute osseous abnormality. IMPRESSION: 1. New coarse reticulonodular interstitial opacities at the lung  bases which may reflect atypical infection or smoking-related interstitial lung disease. Electronically Signed   By: Obie Dredge M.D.   On: 07/19/2021 13:54        Scheduled Meds:  albuterol  2 puff Inhalation Q4H   vitamin C  500 mg Oral Daily   chlorpheniramine-HYDROcodone  5 mL Oral Q12H   DULoxetine  60 mg Oral BID   fluticasone furoate-vilanterol  1 puff Inhalation Daily   guaiFENesin-dextromethorphan  10 mL Oral Q8H  heparin  5,000 Units Subcutaneous Q8H   methylPREDNISolone (SOLU-MEDROL) injection  80 mg Intravenous Q12H   metoprolol succinate  50 mg Oral Daily   morphine  30 mg Oral BID   nicotine  14 mg Transdermal Daily   oxyCODONE-acetaminophen  1 tablet Oral TID   And   oxyCODONE  5 mg Oral TID   potassium chloride SA  40 mEq Oral BID   sodium chloride flush  3 mL Intravenous Q12H   sodium chloride flush  3 mL Intravenous Q12H   zinc sulfate  220 mg Oral Daily   Continuous Infusions:  sodium chloride     potassium chloride 10 mEq (07/20/21 0953)   remdesivir 100 mg in NS 100 mL Stopped (07/20/21 1000)     LOS: 1 day    Time spent: 35 minutes    Garrie Woodin Hoover Brunette, DO [facility]  If 7PM-7AM, please contact night-coverage [email or web address] 07/20/2021, 10:17 AM

## 2021-07-20 NOTE — Progress Notes (Signed)
O2 decreased from 3lpm to 2lpm cann pt doesn't wear o2 at home spo2 97% on 3lpm cann Also encouraged patient to use IS and Flutter

## 2021-07-20 NOTE — Plan of Care (Signed)
  Problem: Acute Rehab PT Goals(only PT should resolve) Goal: Pt Will Go Supine/Side To Sit Outcome: Progressing Flowsheets (Taken 07/20/2021 1400) Pt will go Supine/Side to Sit: with modified independence Goal: Patient Will Transfer Sit To/From Stand Outcome: Progressing Flowsheets (Taken 07/20/2021 1400) Patient will transfer sit to/from stand:  with supervision  with min guard assist Goal: Pt Will Transfer Bed To Chair/Chair To Bed Outcome: Progressing Flowsheets (Taken 07/20/2021 1400) Pt will Transfer Bed to Chair/Chair to Bed: min guard assist Goal: Pt Will Ambulate Outcome: Progressing Flowsheets (Taken 07/20/2021 1400) Pt will Ambulate:  50 feet  with min guard assist  with rolling walker  with least restrictive assistive device   2:00 PM, 07/20/21 Ocie Bob, MPT Physical Therapist with [facility] [phone] office [phone] mobile phone

## 2021-07-21 DIAGNOSIS — R6510 Systemic inflammatory response syndrome (SIRS) of non-infectious origin without acute organ dysfunction: Secondary | ICD-10-CM | POA: Diagnosis not present

## 2021-07-21 LAB — CBC
HCT: 42.9 % (ref 36.0–46.0)
Hemoglobin: 14.6 g/dL (ref 12.0–15.0)
MCH: 36.5 pg — ABNORMAL HIGH (ref 26.0–34.0)
MCHC: 34 g/dL (ref 30.0–36.0)
MCV: 107.3 fL — ABNORMAL HIGH (ref 80.0–100.0)
Platelets: 357 10*3/uL (ref 150–400)
RBC: 4 MIL/uL (ref 3.87–5.11)
RDW: 12.9 % (ref 11.5–15.5)
WBC: 11.3 10*3/uL — ABNORMAL HIGH (ref 4.0–10.5)
nRBC: 0 % (ref 0.0–0.2)

## 2021-07-21 LAB — BASIC METABOLIC PANEL
Anion gap: 6 (ref 5–15)
BUN: 18 mg/dL (ref 8–23)
CO2: 20 mmol/L — ABNORMAL LOW (ref 22–32)
Calcium: 8.9 mg/dL (ref 8.9–10.3)
Chloride: 112 mmol/L — ABNORMAL HIGH (ref 98–111)
Creatinine, Ser: 0.6 mg/dL (ref 0.44–1.00)
GFR, Estimated: >60 mL/min (ref >60)
Glucose, Bld: 136 mg/dL — ABNORMAL HIGH (ref 70–99)
Potassium: 4.2 mmol/L (ref 3.5–5.1)
Sodium: 138 mmol/L (ref 135–145)

## 2021-07-21 LAB — GLUCOSE, CAPILLARY: Glucose-Capillary: 131 mg/dL — ABNORMAL HIGH (ref 70–99)

## 2021-07-21 LAB — C-REACTIVE PROTEIN: CRP: 12.7 mg/dL — ABNORMAL HIGH (ref <1.0)

## 2021-07-21 LAB — MAGNESIUM: Magnesium: 1.6 mg/dL — ABNORMAL LOW (ref 1.7–2.4)

## 2021-07-21 MED ORDER — OXYCODONE HCL 5 MG PO TABS
5.0000 mg | ORAL_TABLET | ORAL | Status: DC | PRN
  Administered 2021-07-21 – 2021-07-22 (×2): 5 mg via ORAL
  Filled 2021-07-21 (×2): qty 1

## 2021-07-21 MED ORDER — MAGNESIUM SULFATE 2 GM/50ML IV SOLN
2.0000 g | Freq: Once | INTRAVENOUS | Status: AC
  Administered 2021-07-21: 2 g via INTRAVENOUS
  Filled 2021-07-21: qty 50

## 2021-07-21 MED ORDER — OXYCODONE-ACETAMINOPHEN 5-325 MG PO TABS
1.0000 | ORAL_TABLET | ORAL | Status: DC | PRN
Start: 2021-07-21 — End: 2021-07-24
  Administered 2021-07-21 – 2021-07-24 (×6): 1 via ORAL
  Filled 2021-07-21 (×6): qty 1

## 2021-07-21 MED ORDER — DOXYCYCLINE HYCLATE 100 MG PO TABS
100.0000 mg | ORAL_TABLET | Freq: Two times a day (BID) | ORAL | Status: DC
  Administered 2021-07-21 – 2021-07-23 (×5): 100 mg via ORAL
  Filled 2021-07-21 (×6): qty 1

## 2021-07-21 NOTE — Plan of Care (Signed)
  Problem: Acute Rehab OT Goals (only OT should resolve) Goal: Pt. Will Perform Grooming Flowsheets (Taken 07/21/2021 1609) Pt Will Perform Grooming:  Independently  standing Goal: Pt. Will Transfer To Toilet Flowsheets (Taken 07/21/2021 1609) Pt Will Transfer to Toilet:  Independently  stand pivot transfer  ambulating Goal: Pt/Caregiver Will Perform Home Exercise Program Flowsheets (Taken 07/21/2021 1609) Pt/caregiver will Perform Home Exercise Program:  Increased strength  Both right and left upper extremity  Independently  Keamber Macfadden OT, MOT

## 2021-07-21 NOTE — Progress Notes (Addendum)
O2 decreased back down to 2lpm from 3lpm cann spo2 97% pt does not wear o2 at home

## 2021-07-21 NOTE — Progress Notes (Signed)
PROGRESS NOTE    Renee Oliver  ZOX:[id number] DOB: Jul 09, 1956 DOA: 07/19/2021 PCP: Mitzi Hansen, NP   Brief Narrative:  Renee Oliver is a 65 y.o. female with medical history significant of with history of COPD, depression, hypertension anxiety and smoker presents ED with chief complaint of shortness of breath, and palpitation.  Reporting of nonproductive cough for past 6 days progressively getting worse.  Patient was admitted with acute hypoxemic respiratory failure secondary to COVID-pneumonia and COPD exacerbation.  She was initially started on some antibiotics due to suspicion of superimposed bacterial pneumonia, but procalcitonin level is low.  She continues to have ongoing wheezing and shortness of breath.  Assessment & Plan:   Principal Problem:   SIRS (systemic inflammatory response syndrome) (HCC) Active Problems:   Palpitation   Essential hypertension   PNA (pneumonia)   Acute respiratory failure with hypoxia and hypercapnia (HCC)   COVID-19 virus infection   Acute hypoxemic respiratory failure secondary to COPD exacerbation.  COPD exacerbation likely due to COVID-19 virus infection.   Continue to monitor due to significant symptoms.  Patient is still on 2 L oxygen.   chest physiotherapy, incentive spirometry, deep breathing exercises, sputum induction, mucolytic's and bronchodilators. Supplemental oxygen to keep saturations more than 90%. Covid directed therapy with , steroids, Solu-Medrol IV. remdesivir, day 2/5. Initially on antibiotics that was discontinued.  Though patient has COVID-19, her symptomatology likely related to COPD exacerbation.  No evidence of consolidation on chest x-ray.  She has a change in her sputum.  We will treat with doxycycline for 7 days. Due to severity of symptoms, patient will need daily inflammatory markers, chest x-rays, liver function test to monitor and direct COVID-19 therapies.   Hypokalemia/hypomagnesemia -Replete and reevaluate  in a.m.  Essential hypertension -Holding home blood pressure medications -IV hydralazine as needed  History of COPD -Continue steroids and breathing treatments as needed.  Patient is on Symbicort at home but not on any albuterol.  Will prescribe albuterol on discharge.  Depression -Continue Cymbalta  Chronic pain syndrome -Patient is on chronic pain management with MS Contin 30 mg twice a day along with Percocet 10/325 5 times a day.  Patient was worried about not able to go back on her medications.  We will change her Percocet doses.  Smoker: Ongoing smoker.  Nicotine patch provided.  Poor prognosis with ongoing smoking and advanced COPD.  DVT prophylaxis: Heparin Code Status: Full Family Communication: None. Disposition Plan:  Status is: Inpatient  Remains inpatient appropriate because: Requires IV medications.   Consultants:  None  Procedures:  See below  Antimicrobials:  Anti-infectives (From admission, onward)    Start     Dose/Rate Route Frequency Ordered Stop   07/21/21 1215  doxycycline (VIBRA-TABS) tablet 100 mg        100 mg Oral Every 12 hours 07/21/21 1115     07/20/21 1000  remdesivir 100 mg in sodium chloride 0.9 % 100 mL IVPB        100 mg 200 mL/hr over 30 Minutes Intravenous Daily 07/19/21 1600 07/24/21 0959   07/20/21 0600  levofloxacin (LEVAQUIN) IVPB 750 mg  Status:  Discontinued        750 mg 100 mL/hr over 90 Minutes Intravenous  Once 07/19/21 1451 07/19/21 1502   07/19/21 1630  remdesivir 100 mg in sodium chloride 0.9 % 100 mL IVPB        100 mg 200 mL/hr over 30 Minutes Intravenous Every 1 hr x 2 07/19/21 1600 07/19/21  1836   07/19/21 1530  cefTRIAXone (ROCEPHIN) 1 g in sodium chloride 0.9 % 100 mL IVPB  Status:  Discontinued        1 g 200 mL/hr over 30 Minutes Intravenous Every 24 hours 07/19/21 1502 07/20/21 0737   07/19/21 1315  azithromycin (ZITHROMAX) 500 mg in sodium chloride 0.9 % 250 mL IVPB  Status:  Discontinued        500 mg 250  mL/hr over 60 Minutes Intravenous Every 24 hours 07/19/21 1309 07/20/21 0737       Subjective: Patient was seen and examined.  Complains of ongoing wheezing and cough.  She is more concerned about not going on her appropriate doses of pain medications that she takes at home.  Also worried about her restriction of diet.  Remains on 2 L oxygen.  Afebrile.  She feels extremely weak.  Objective: Vitals:   07/21/21 0443 07/21/21 0500 07/21/21 0732 07/21/21 1231  BP: (!) 176/86     Pulse: 65     Resp: 19     Temp: 98.4 F (36.9 C)     TempSrc:      SpO2: 98%  98% 97%  Weight:  54.7 kg    Height:        Intake/Output Summary (Last 24 hours) at 07/21/2021 1350 Last data filed at 07/21/2021 1008 Gross per 24 hour  Intake 486.12 ml  Output 500 ml  Net -13.88 ml   Filed Weights   07/19/21 1249 07/21/21 0500  Weight: 54.4 kg 54.7 kg    Examination:  General: Frail and debilitated.  Older than his stated age.  Not in any obvious distress.  She is on 2 L oxygen. Cardiovascular: S1-S2 normal.  Regular rate rhythm. Respiratory: Bilateral conducted airway sounds.  She has poor air entry on both sides, both inspiratory and expiratory wheezes. Gastrointestinal: Soft.  Nontender.  Bowel sounds present. Ext: No cyanosis or edema.  No swelling. Neuro: Alert and oriented.  No focal deficits.   Data Reviewed: I have personally reviewed following labs and imaging studies  CBC: Recent Labs  Lab 07/19/21 1305 07/19/21 1526 07/20/21 0352 07/21/21 0606  WBC 12.5* 12.4* 8.6 11.3*  NEUTROABS 8.7*  --   --   --   HGB 16.8* 15.5* 13.8 14.6  HCT 48.8* 46.6* 40.5 42.9  MCV 104.1* 107.9* 105.7* 107.3*  PLT 310 252 301 357   Basic Metabolic Panel: Recent Labs  Lab 07/19/21 1305 07/19/21 1526 07/20/21 0352 07/21/21 0606  NA 132*  --  135 138  K 3.1*  --  2.8* 4.2  CL 92*  --  104 112*  CO2 29  --  23 20*  GLUCOSE 112*  --  88 136*  BUN 22  --  15 18  CREATININE 0.91 0.75 0.53 0.60   CALCIUM 8.5*  --  8.0* 8.9  MG  --   --  1.7 1.6*   GFR: Estimated Creatinine Clearance: 55.4 mL/min (by C-G formula based on SCr of 0.6 mg/dL). Liver Function Tests: Recent Labs  Lab 07/19/21 1305  AST 36  ALT 42  ALKPHOS 135*  BILITOT 0.7  PROT 7.1  ALBUMIN 3.0*   No results for input(s): LIPASE, AMYLASE in the last 168 hours. No results for input(s): AMMONIA in the last 168 hours. Coagulation Profile: Recent Labs  Lab 07/19/21 1305 07/20/21 0352  INR 1.0 1.1   Cardiac Enzymes: No results for input(s): CKTOTAL, CKMB, CKMBINDEX, TROPONINI in the last 168 hours. BNP (last  3 results) No results for input(s): PROBNP in the last 8760 hours. HbA1C: No results for input(s): HGBA1C in the last 72 hours. CBG: Recent Labs  Lab 07/20/21 0933 07/21/21 0739  GLUCAP 117* 131*   Lipid Profile: No results for input(s): CHOL, HDL, LDLCALC, TRIG, CHOLHDL, LDLDIRECT in the last 72 hours. Thyroid Function Tests: No results for input(s): TSH, T4TOTAL, FREET4, T3FREE, THYROIDAB in the last 72 hours. Anemia Panel: No results for input(s): VITAMINB12, FOLATE, FERRITIN, TIBC, IRON, RETICCTPCT in the last 72 hours. Sepsis Labs: Recent Labs  Lab 07/19/21 1301 07/19/21 1526 07/19/21 1701 07/19/21 2033  PROCALCITON  --  <0.10  --   --   LATICACIDVEN 1.4 3.1* 1.2 1.1    Recent Results (from the past 240 hour(s))  MRSA Next Gen by PCR, Nasal     Status: None   Collection Time: 07/19/21 10:36 AM   Specimen: Nasal Mucosa; Nasal Swab  Result Value Ref Range Status   MRSA by PCR Next Gen NOT DETECTED NOT DETECTED Final    Comment: (NOTE) The GeneXpert MRSA Assay (FDA approved for NASAL specimens only), is one component of a comprehensive MRSA colonization surveillance program. It is not intended to diagnose MRSA infection nor to guide or monitor treatment for MRSA infections. Test performance is not FDA approved in patients less than 75 years old. Performed at [facility],  [address] [place], [place] [address]   Culture, blood (Routine x 2)     Status: None (Preliminary result)   Collection Time: 07/19/21  1:16 PM   Specimen: BLOOD RIGHT FOREARM  Result Value Ref Range Status   Specimen Description BLOOD RIGHT FOREARM  Final   Special Requests   Final    Blood Culture results may not be optimal due to an inadequate volume of blood received in culture bottles BOTTLES DRAWN AEROBIC AND ANAEROBIC   Culture   Final    NO GROWTH 2 DAYS Performed at [facility], [address] [place], [place] [address]    Report Status PENDING  Incomplete  Culture, blood (Routine x 2)     Status: None (Preliminary result)   Collection Time: 07/19/21  1:16 PM   Specimen: BLOOD LEFT HAND  Result Value Ref Range Status   Specimen Description BLOOD LEFT HAND  Final   Special Requests   Final    Blood Culture adequate volume BOTTLES DRAWN AEROBIC AND ANAEROBIC   Culture   Final    NO GROWTH 2 DAYS Performed at [facility], [address], [place], [place] [address]    Report Status PENDING  Incomplete  Resp Panel by RT-PCR (Flu A&B, Covid) Nasopharyngeal Swab     Status: Abnormal   Collection Time: 07/19/21  1:20 PM   Specimen: Nasopharyngeal Swab; Nasopharyngeal(NP) swabs in vial transport medium  Result Value Ref Range Status   SARS Coronavirus 2 by RT PCR POSITIVE (A) NEGATIVE Final    Comment: WHITE,M AT 1550 ON 10.18.22 BY RUCINSKI,B (NOTE) SARS-CoV-2 target nucleic acids are DETECTED.  The SARS-CoV-2 RNA is generally detectable in upper respiratory specimens during the acute phase of infection. Positive results are indicative of the presence of the identified virus, but do not rule out bacterial infection or co-infection with other pathogens not detected by the test. Clinical correlation with patient history and other diagnostic information is necessary to determine patient infection status. The expected result is Negative.  Fact Sheet for  Patients: BloggerCourse.com  Fact Sheet for Healthcare Providers: SeriousBroker.it  This test  is not yet approved or cleared by the [place] and  has been authorized for detection and/or diagnosis of SARS-CoV-2 by FDA under an Emergency Use Authorization (EUA).  This EUA will remain in effect (meaning this test can be used) for the duration of  the COVID-19 d eclaration under Section 564(b)(1) of the Act, 21 U.S.C. section 360bbb-3(b)(1), unless the authorization is terminated or revoked sooner.     Influenza A by PCR NEGATIVE NEGATIVE Final   Influenza B by PCR NEGATIVE NEGATIVE Final    Comment: (NOTE) The Xpert Xpress SARS-CoV-2/FLU/RSV plus assay is intended as an aid in the diagnosis of influenza from Nasopharyngeal swab specimens and should not be used as a sole basis for treatment. Nasal washings and aspirates are unacceptable for Xpert Xpress SARS-CoV-2/FLU/RSV testing.  Fact Sheet for Patients: BloggerCourse.com  Fact Sheet for Healthcare Providers: SeriousBroker.it  This test is not yet approved or cleared by the [place] FDA and has been authorized for detection and/or diagnosis of SARS-CoV-2 by FDA under an Emergency Use Authorization (EUA). This EUA will remain in effect (meaning this test can be used) for the duration of the COVID-19 declaration under Section 564(b)(1) of the Act, 21 U.S.C. section 360bbb-3(b)(1), unless the authorization is terminated or revoked.  Performed at [facility], [address] [place], [place] [address]          Radiology Studies: No results found.      Scheduled Meds:  albuterol  2 puff Inhalation Q4H   vitamin C  500 mg Oral Daily   chlorpheniramine-HYDROcodone  5 mL Oral Q12H   doxycycline  100 mg Oral Q12H   DULoxetine  60 mg Oral BID   fluticasone furoate-vilanterol  1 puff Inhalation Daily    guaiFENesin-dextromethorphan  10 mL Oral Q8H   heparin  5,000 Units Subcutaneous Q8H   methylPREDNISolone (SOLU-MEDROL) injection  80 mg Intravenous Q12H   metoprolol succinate  50 mg Oral Daily   morphine  30 mg Oral BID   nicotine  14 mg Transdermal Daily   potassium chloride SA  40 mEq Oral BID   sodium chloride flush  3 mL Intravenous Q12H   sodium chloride flush  3 mL Intravenous Q12H   zinc sulfate  220 mg Oral Daily   Continuous Infusions:  sodium chloride     remdesivir 100 mg in NS 100 mL 100 mg (07/21/21 0822)     LOS: 2 days    Time spent: 35 minutes    Dorcas Carrow, MD [facility]  If 7PM-7AM, please contact night-coverage [email or web address] 07/21/2021, 1:50 PM

## 2021-07-21 NOTE — Plan of Care (Signed)
  Problem: Education: Goal: Knowledge of General Education information will improve Description: Including pain rating scale, medication(s)/side effects and non-pharmacologic comfort measures Outcome: Progressing   Problem: Pain Managment: Goal: General experience of comfort will improve Outcome: Progressing   Problem: Coping: Goal: Psychosocial and spiritual needs will be supported Outcome: Progressing   Problem: Respiratory: Goal: Will maintain a patent airway Outcome: Progressing

## 2021-07-21 NOTE — Progress Notes (Addendum)
Physical Therapy Treatment Patient Details Name: Renee Oliver MRN: [id number] DOB: [birth year] Today's Date: 07/21/2021   History of Present Illness Renee Oliver is a 65 y.o. female with medical history significant of with history of COPD, depression, hypertension anxiety and smoker presents ED with chief complaint of shortness of breath, and palpitation.  Reporting of nonproductive cough for past 6 days days, progressing getting worse.    PT Comments    Patient in bed awake, alert, and agreeable for therapy. Patient maintained an SpO2 average of 90% during seated exercises, demonstrated increased endurance w/ ambulation using RW on room air averaging an SpO2 of 86%. Patient tolerated sitting up in chair after therapy on room air w/ SpO2 at 92%- RN notified. Patient will benefit from continued physical therapy in hospital and recommended venue below to increase strength, balance, endurance for safe ADLs and gait.   Recommendations for follow up therapy are one component of a multi-disciplinary discharge planning process, led by the attending physician.  Recommendations may be updated based on patient status, additional functional criteria and insurance authorization.  Follow Up Recommendations  SNF;Supervision for mobility/OOB;Supervision - Intermittent     Equipment Recommendations  Rolling walker with 5" wheels    Recommendations for Other Services       Precautions / Restrictions Precautions Precautions: Fall Restrictions Weight Bearing Restrictions: No     Mobility  Bed Mobility Overal bed mobility: Needs Assistance Bed Mobility: Supine to Sit     Supine to sit: Modified independent (Device/Increase time)     General bed mobility comments: HOB elevated, slow labored movement    Transfers Overall transfer level: Needs assistance Equipment used: Rolling walker (2 wheeled) Transfers: Sit to/from Stand Sit to Stand: Supervision;Min guard         General  transfer comment: using RW  Ambulation/Gait Ambulation/Gait assistance: Min guard;Min assist Gait Distance (Feet): 20 Feet Assistive device: Rolling walker (2 wheeled) Gait Pattern/deviations: Decreased step length - right;Decreased step length - left;Decreased stride length Gait velocity: Decreased   General Gait Details: slow labored unsteady shaky movment, limited mostly due to fatigue with SpO2 dropping to 86% on room air   Stairs             Wheelchair Mobility    Modified Rankin (Stroke Patients Only)       Balance Overall balance assessment: Needs assistance Sitting-balance support: Feet supported;No upper extremity supported Sitting balance-Leahy Scale: Fair Sitting balance - Comments: seated at EOB   Standing balance support: During functional activity;Bilateral upper extremity supported Standing balance-Leahy Scale: Fair Standing balance comment: using RW                            Cognition Arousal/Alertness: Awake/alert Behavior During Therapy: WFL for tasks assessed/performed Overall Cognitive Status: Within Functional Limits for tasks assessed                                        Exercises General Exercises - Lower Extremity Long Arc Quad: Seated;AROM;Strengthening;Both;Right;Left;10 reps Hip Flexion/Marching: Seated;AROM;Strengthening;Both;Right;Left;10 reps Toe Raises: Seated;AROM;Strengthening;Both;Right;Left;10 reps Heel Raises: Seated;AROM;Strengthening;Both;Right;Left;10 reps    General Comments        Pertinent Vitals/Pain Pain Assessment: No/denies pain    Home Living                      Prior Function  PT Goals (current goals can now be found in the care plan section) Acute Rehab PT Goals Patient Stated Goal: return home with family assist PT Goal Formulation: With patient Time For Goal Achievement: 08/03/21 Potential to Achieve Goals: Good Progress towards PT goals:  Progressing toward goals    Frequency    Min 3X/week      PT Plan      Co-evaluation              AM-PAC PT "6 Clicks" Mobility   Outcome Measure  Help needed turning from your back to your side while in a flat bed without using bedrails?: None Help needed moving from lying on your back to sitting on the side of a flat bed without using bedrails?: A Little Help needed moving to and from a bed to a chair (including a wheelchair)?: A Little Help needed standing up from a chair using your arms (e.g., wheelchair or bedside chair)?: A Little Help needed to walk in hospital room?: A Lot Help needed climbing 3-5 steps with a railing? : A Lot 6 Click Score: 17    End of Session Equipment Utilized During Treatment: Oxygen Activity Tolerance: Patient tolerated treatment well;Patient limited by fatigue Patient left: in chair;with call bell/phone within reach;with chair alarm set Nurse Communication: Mobility status PT Visit Diagnosis: Unsteadiness on feet (R26.81);Other abnormalities of gait and mobility (R26.89);Muscle weakness (generalized) (M62.81)     Time: 4034-7425 PT Time Calculation (min) (ACUTE ONLY): 26 min  Charges:  $Therapeutic Exercise: 8-22 mins $Therapeutic Activity: 8-22 mins                     Cassie Jones, SPT  During this treatment session, the therapist was present, participating in and directing the treatment.  3:43 PM, 07/21/21 Ocie Bob, MPT Physical Therapist with [facility] 336 [phone] office [phone] mobile phone

## 2021-07-21 NOTE — Evaluation (Signed)
Occupational Therapy Evaluation Patient Details Name: RIYAN GAVINA MRN: [id number] DOB: March 03, 1956 Today's Date: 07/21/2021   History of Present Illness LEXIE KOEHL is a 65 y.o. female with medical history significant of with history of COPD, depression, hypertension anxiety and smoker presents ED with chief complaint of shortness of breath, and palpitation.  Reporting of nonproductive cough for past 6 days days, progressing getting worse.   Clinical Impression   Pt presents with general weakness and lack of endurance. When ambulating with RW to toilet pt required Supervision to min G assist with desaturation to ~88% SpO2. After verbal cues for breathing in through nose and out through mouth pt was able to recover to 90% SpO2 prior to return to chair where pt was placed back on 3L O2 with SpO2 increasing to 97%. Pt reports partner would be able to assist for a couple weeks at home. Pt will benefit from continued OT in the hospital and recommended venue below to increase strength, balance, and endurance for safe ADL's.        Recommendations for follow up therapy are one component of a multi-disciplinary discharge planning process, led by the attending physician.  Recommendations may be updated based on patient status, additional functional criteria and insurance authorization.   Follow Up Recommendations  Home health OT;Supervision - Intermittent (supervision for mobility)    Equipment Recommendations  Tub/shower seat           Precautions / Restrictions Precautions Precautions: Fall Restrictions Weight Bearing Restrictions: No      Mobility Bed Mobility Overal bed mobility: Needs Assistance Bed Mobility: Supine to Sit     Supine to sit: Modified independent (Device/Increase time)     General bed mobility comments: HOB elevated, slow labored movement    Transfers Overall transfer level: Needs assistance Equipment used: Rolling walker (2 wheeled) Transfers: Sit  to/from Stand Sit to Stand: Supervision;Min guard         General transfer comment: using RW    Balance Overall balance assessment: Needs assistance Sitting-balance support: Feet supported;No upper extremity supported Sitting balance-Leahy Scale: Fair Sitting balance - Comments: seated at EOB   Standing balance support: During functional activity;Bilateral upper extremity supported Standing balance-Leahy Scale: Fair Standing balance comment: using RW                           ADL either performed or assessed with clinical judgement   ADL Overall ADL's : Needs assistance/impaired                     Lower Body Dressing: Independent;Sitting/lateral leans Lower Body Dressing Details (indicate cue type and reason): donning and doffing sock Toilet Transfer: Min guard;Supervision/safety;RW;Ambulation Toilet Transfer Details (indicate cue type and reason): Ambulatory transfer from chair to toilet and back.         Functional mobility during ADLs: Min guard;Supervision/safety;Rolling walker       Vision Baseline Vision/History: 1 Wears glasses Ability to See in Adequate Light: 0 Adequate Patient Visual Report: No change from baseline Vision Assessment?: No apparent visual deficits (per observation; pt able to read board)                Pertinent Vitals/Pain Pain Assessment: 0-10 Pain Score: 8  Pain Location: low back and R ribs Pain Descriptors / Indicators: Sharp Pain Intervention(s): Limited activity within patient's tolerance;Monitored during session;Repositioned     Hand Dominance Left   Extremity/Trunk Assessment Upper Extremity Assessment  Upper Extremity Assessment: Generalized weakness   Lower Extremity Assessment Lower Extremity Assessment: Defer to PT evaluation   Cervical / Trunk Assessment Cervical / Trunk Assessment: Normal   Communication Communication Communication: No difficulties   Cognition Arousal/Alertness:  Awake/alert Behavior During Therapy: WFL for tasks assessed/performed Overall Cognitive Status: Within Functional Limits for tasks assessed                                           Exercises General Exercises - Lower Extremity Long Arc Quad: Seated;AROM;Strengthening;Both;Right;Left;10 reps Hip Flexion/Marching: Seated;AROM;Strengthening;Both;Right;Left;10 reps Toe Raises: Seated;AROM;Strengthening;Both;Right;Left;10 reps Heel Raises: Seated;AROM;Strengthening;Both;Right;Left;10 reps         Home Living Family/patient expects to be discharged to:: Private residence Living Arrangements: Spouse/significant other Available Help at Discharge: Family;Available 24 hours/day Type of Home: Mobile home Home Access: Stairs to enter [employer] of Steps: 2 Entrance Stairs-Rails: Right;Left;Can reach both Home Layout: One level     Bathroom Shower/Tub: Industrial/product designer Accessibility: Yes   Home Equipment: None   Additional Comments: taken from PT note      Prior Functioning/Environment Level of Independence: Independent        Comments: Tourist information centre manager, drives.        OT Problem List: Decreased strength;Decreased activity tolerance;Impaired balance (sitting and/or standing)      OT Treatment/Interventions: Self-care/ADL training;Therapeutic exercise;Therapeutic activities;Patient/family education;Balance training;Energy conservation;DME and/or AE instruction    OT Goals(Current goals can be found in the care plan section) Acute Rehab OT Goals Patient Stated Goal: return home with family assist OT Goal Formulation: With patient Time For Goal Achievement: 08/04/21 Potential to Achieve Goals: Good  OT Frequency: Min 2X/week    End of Session Equipment Utilized During Treatment: Rolling walker;Oxygen (Pt placed back on 3 L O2) Nurse Communication: Mobility status;Other (comment) (desaturation)  Activity  Tolerance: Patient tolerated treatment well Patient left:    OT Visit Diagnosis: Muscle weakness (generalized) (M62.81);Unsteadiness on feet (R26.81);Other abnormalities of gait and mobility (R26.89)                Time: 9476-5465 OT Time Calculation (min): 14 min Charges:  OT General Charges $OT Visit: 1 Visit OT Evaluation $OT Eval Low Complexity: 1 Low  Pranay Hilbun OT, MOT  Danie Chandler 07/21/2021, 4:06 PM

## 2021-07-22 ENCOUNTER — Inpatient Hospital Stay (HOSPITAL_COMMUNITY): Payer: Medicare Other

## 2021-07-22 DIAGNOSIS — R6510 Systemic inflammatory response syndrome (SIRS) of non-infectious origin without acute organ dysfunction: Secondary | ICD-10-CM | POA: Diagnosis not present

## 2021-07-22 LAB — BASIC METABOLIC PANEL
Anion gap: 7 (ref 5–15)
BUN: 16 mg/dL (ref 8–23)
CO2: 23 mmol/L (ref 22–32)
Calcium: 8.7 mg/dL — ABNORMAL LOW (ref 8.9–10.3)
Chloride: 108 mmol/L (ref 98–111)
Creatinine, Ser: 0.49 mg/dL (ref 0.44–1.00)
GFR, Estimated: >60 mL/min (ref >60)
Glucose, Bld: 131 mg/dL — ABNORMAL HIGH (ref 70–99)
Potassium: 4.8 mmol/L (ref 3.5–5.1)
Sodium: 138 mmol/L (ref 135–145)

## 2021-07-22 LAB — MAGNESIUM: Magnesium: 1.8 mg/dL (ref 1.7–2.4)

## 2021-07-22 LAB — CBC
HCT: 41.7 % (ref 36.0–46.0)
Hemoglobin: 14.1 g/dL (ref 12.0–15.0)
MCH: 36.4 pg — ABNORMAL HIGH (ref 26.0–34.0)
MCHC: 33.8 g/dL (ref 30.0–36.0)
MCV: 107.8 fL — ABNORMAL HIGH (ref 80.0–100.0)
Platelets: 374 10*3/uL (ref 150–400)
RBC: 3.87 MIL/uL (ref 3.87–5.11)
RDW: 13.2 % (ref 11.5–15.5)
WBC: 13.8 10*3/uL — ABNORMAL HIGH (ref 4.0–10.5)
nRBC: 0 % (ref 0.0–0.2)

## 2021-07-22 LAB — GLUCOSE, CAPILLARY: Glucose-Capillary: 128 mg/dL — ABNORMAL HIGH (ref 70–99)

## 2021-07-22 LAB — PHOSPHORUS: Phosphorus: 2.8 mg/dL (ref 2.5–4.6)

## 2021-07-22 LAB — C-REACTIVE PROTEIN: CRP: 6.7 mg/dL — ABNORMAL HIGH (ref <1.0)

## 2021-07-22 LAB — D-DIMER, QUANTITATIVE: D-Dimer, Quant: 2.91 ug/mL-FEU — ABNORMAL HIGH (ref 0.00–0.50)

## 2021-07-22 MED ORDER — IOHEXOL 350 MG/ML SOLN
75.0000 mL | Freq: Once | INTRAVENOUS | Status: AC | PRN
  Administered 2021-07-22: 75 mL via INTRAVENOUS

## 2021-07-22 MED ORDER — DIPHENHYDRAMINE HCL 50 MG/ML IJ SOLN
50.0000 mg | Freq: Once | INTRAMUSCULAR | Status: AC
  Administered 2021-07-22: 50 mg via INTRAVENOUS
  Filled 2021-07-22: qty 1

## 2021-07-22 MED ORDER — ALBUTEROL SULFATE HFA 108 (90 BASE) MCG/ACT IN AERS
2.0000 | INHALATION_SPRAY | RESPIRATORY_TRACT | Status: DC
  Administered 2021-07-23 – 2021-07-24 (×6): 2 via RESPIRATORY_TRACT

## 2021-07-22 MED ORDER — DEXAMETHASONE 4 MG PO TABS
6.0000 mg | ORAL_TABLET | Freq: Every day | ORAL | Status: DC
  Administered 2021-07-23 – 2021-07-24 (×2): 6 mg via ORAL
  Filled 2021-07-22 (×2): qty 2

## 2021-07-22 NOTE — Progress Notes (Signed)
PROGRESS NOTE    Renee Oliver  GYJ:[id number] DOB: July 13, 1956 DOA: 07/19/2021 PCP: Mitzi Hansen, NP   Brief Narrative:  Renee Oliver is a 65 y.o. female with medical history significant of with history of COPD, depression, hypertension, anxiety and smoker presented to ED with chief complaint of shortness of breath, and palpitation.  Reporting of nonproductive cough for past 6 days progressively getting worse.  Patient was admitted with acute hypoxemic respiratory failure secondary to COVID-pneumonia and COPD exacerbation.  She continues to have ongoing wheezing and shortness of breath.  Assessment & Plan:   Principal Problem:   SIRS (systemic inflammatory response syndrome) (HCC) Active Problems:   Palpitation   Essential hypertension   PNA (pneumonia)   Acute respiratory failure with hypoxia and hypercapnia (HCC)   COVID-19 virus infection   Acute hypoxemic respiratory failure secondary to COPD exacerbation.  COPD exacerbation likely due to COVID-19 virus infection.   Continue to monitor due to significant symptoms.  Patient is still on 2 L oxygen.   chest physiotherapy, incentive spirometry, deep breathing exercises, sputum induction, mucolytic's and bronchodilators. Supplemental oxygen to keep saturations more than 90%. Covid directed therapy with , steroids, Solu-Medrol IV.  Minor allergy to prednisone.  Will change to dexamethasone oral by tomorrow. remdesivir, day 3/5. Initially on antibiotics that was discontinued.  Though patient has COVID-19, her symptomatology likely related to COPD exacerbation.  No evidence of consolidation on chest x-ray.  She has a change in her sputum.  We will treat with doxycycline for 7 days. Due to severity of symptoms, patient will need daily inflammatory markers, chest x-rays, liver function test to monitor and direct COVID-19 therapies.  Her D-dimer is significantly elevated.  Patient is high risk of pulmonary embolism with acute COVID  infection, COPD.  We will check CTA of the chest. Patient does have allergy to contrast dye, she has received Solu-Medrol 80 mg twice a day for the last 3 days, will give 1 dose of Benadryl 50 mg IV 1 hour before contrasted studies.  She is adequately prepped to undergo iodine contrast.  Hypokalemia/hypomagnesemia -Repleted.  Adequate.  Essential hypertension -Holding home blood pressure medications -IV hydralazine as needed  History of COPD -Continue steroids and breathing treatments as needed.  Patient is on Symbicort at home but not on any albuterol.  Will prescribe albuterol on discharge.  Depression -Continue Cymbalta  Chronic pain syndrome -Patient is on chronic pain management with MS Contin 30 mg twice a day along with Percocet 10/325 5 times a day.  Currently on home doses of opiates.   Smoker: Ongoing smoker.  Nicotine patch provided.  Poor prognosis with ongoing smoking and advanced COPD.  DVT prophylaxis: Heparin Code Status: Full Family Communication: None. Disposition Plan:  Status is: Inpatient  Remains inpatient appropriate because: Requiring oxygen, monitoring for COVID-19 infection.  IV antiviral treatment.   Consultants:  None  Procedures:  See below  Antimicrobials:  Anti-infectives (From admission, onward)    Start     Dose/Rate Route Frequency Ordered Stop   07/21/21 1215  doxycycline (VIBRA-TABS) tablet 100 mg        100 mg Oral Every 12 hours 07/21/21 1115     07/20/21 1000  remdesivir 100 mg in sodium chloride 0.9 % 100 mL IVPB        100 mg 200 mL/hr over 30 Minutes Intravenous Daily 07/19/21 1600 07/24/21 0959   07/20/21 0600  levofloxacin (LEVAQUIN) IVPB 750 mg  Status:  Discontinued  750 mg 100 mL/hr over 90 Minutes Intravenous  Once 07/19/21 1451 07/19/21 1502   07/19/21 1630  remdesivir 100 mg in sodium chloride 0.9 % 100 mL IVPB        100 mg 200 mL/hr over 30 Minutes Intravenous Every 1 hr x 2 07/19/21 1600 07/19/21 1836    07/19/21 1530  cefTRIAXone (ROCEPHIN) 1 g in sodium chloride 0.9 % 100 mL IVPB  Status:  Discontinued        1 g 200 mL/hr over 30 Minutes Intravenous Every 24 hours 07/19/21 1502 07/20/21 0737   07/19/21 1315  azithromycin (ZITHROMAX) 500 mg in sodium chloride 0.9 % 250 mL IVPB  Status:  Discontinued        500 mg 250 mL/hr over 60 Minutes Intravenous Every 24 hours 07/19/21 1309 07/20/21 0737       Subjective: Patient seen and examined.  She is worried about not getting her oxycodone 5 times a day and hurting her back.  Still on 2 L oxygen.  Coughing and wheezing is slightly better.  Afebrile.  Was wondering about going home.  Objective: Vitals:   07/22/21 0438 07/22/21 0500 07/22/21 0758 07/22/21 1019  BP: 125/69   (!) 161/89  Pulse: 68   83  Resp: 18     Temp: 98 F (36.7 C)     TempSrc:      SpO2: 95%  96%   Weight:  54.9 kg    Height:        Intake/Output Summary (Last 24 hours) at 07/22/2021 1150 Last data filed at 07/22/2021 1025 Gross per 24 hour  Intake 1040 ml  Output 700 ml  Net 340 ml   Filed Weights   07/19/21 1249 07/21/21 0500 07/22/21 0500  Weight: 54.4 kg 54.7 kg 54.9 kg    Examination:  General: Frail and debilitated.  Older than her stated age.  Not in any obvious distress.  She is on 2 L oxygen. Cardiovascular: S1-S2 normal.  Regular rate rhythm. Respiratory: Bilateral conducted airway sounds.  She has poor air entry on both sides, but no added sounds today. Gastrointestinal: Soft.  Nontender.  Bowel sounds present. Ext: No cyanosis or edema.  No swelling. Neuro: Alert and oriented.  No focal deficits.   Data Reviewed: I have personally reviewed following labs and imaging studies  CBC: Recent Labs  Lab 07/19/21 1305 07/19/21 1526 07/20/21 0352 07/21/21 0606 07/22/21 0552  WBC 12.5* 12.4* 8.6 11.3* 13.8*  NEUTROABS 8.7*  --   --   --   --   HGB 16.8* 15.5* 13.8 14.6 14.1  HCT 48.8* 46.6* 40.5 42.9 41.7  MCV 104.1* 107.9* 105.7* 107.3*  107.8*  PLT 310 252 301 357 374   Basic Metabolic Panel: Recent Labs  Lab 07/19/21 1305 07/19/21 1526 07/20/21 0352 07/21/21 0606 07/22/21 0552  NA 132*  --  135 138 138  K 3.1*  --  2.8* 4.2 4.8  CL 92*  --  104 112* 108  CO2 29  --  23 20* 23  GLUCOSE 112*  --  88 136* 131*  BUN 22  --  15 18 16   CREATININE 0.91 0.75 0.53 0.60 0.49  CALCIUM 8.5*  --  8.0* 8.9 8.7*  MG  --   --  1.7 1.6* 1.8  PHOS  --   --   --   --  2.8   GFR: Estimated Creatinine Clearance: 55.4 mL/min (by C-G formula based on SCr of 0.49 mg/dL). Liver  Function Tests: Recent Labs  Lab 07/19/21 1305  AST 36  ALT 42  ALKPHOS 135*  BILITOT 0.7  PROT 7.1  ALBUMIN 3.0*   No results for input(s): LIPASE, AMYLASE in the last 168 hours. No results for input(s): AMMONIA in the last 168 hours. Coagulation Profile: Recent Labs  Lab 07/19/21 1305 07/20/21 0352  INR 1.0 1.1   Cardiac Enzymes: No results for input(s): CKTOTAL, CKMB, CKMBINDEX, TROPONINI in the last 168 hours. BNP (last 3 results) No results for input(s): PROBNP in the last 8760 hours. HbA1C: No results for input(s): HGBA1C in the last 72 hours. CBG: Recent Labs  Lab 07/20/21 0933 07/21/21 0739 07/22/21 0718  GLUCAP 117* 131* 128*   Lipid Profile: No results for input(s): CHOL, HDL, LDLCALC, TRIG, CHOLHDL, LDLDIRECT in the last 72 hours. Thyroid Function Tests: No results for input(s): TSH, T4TOTAL, FREET4, T3FREE, THYROIDAB in the last 72 hours. Anemia Panel: No results for input(s): VITAMINB12, FOLATE, FERRITIN, TIBC, IRON, RETICCTPCT in the last 72 hours. Sepsis Labs: Recent Labs  Lab 07/19/21 1301 07/19/21 1526 07/19/21 1701 07/19/21 2033  PROCALCITON  --  <0.10  --   --   LATICACIDVEN 1.4 3.1* 1.2 1.1    Recent Results (from the past 240 hour(s))  MRSA Next Gen by PCR, Nasal     Status: None   Collection Time: 07/19/21 10:36 AM   Specimen: Nasal Mucosa; Nasal Swab  Result Value Ref Range Status   MRSA by PCR  Next Gen NOT DETECTED NOT DETECTED Final    Comment: (NOTE) The GeneXpert MRSA Assay (FDA approved for NASAL specimens only), is one component of a comprehensive MRSA colonization surveillance program. It is not intended to diagnose MRSA infection nor to guide or monitor treatment for MRSA infections. Test performance is not FDA approved in patients less than 78 years old. Performed at [facility], [address], [place], [place] [address]   Culture, blood (Routine x 2)     Status: None (Preliminary result)   Collection Time: 07/19/21  1:16 PM   Specimen: BLOOD RIGHT FOREARM  Result Value Ref Range Status   Specimen Description BLOOD RIGHT FOREARM  Final   Special Requests   Final    Blood Culture results may not be optimal due to an inadequate volume of blood received in culture bottles BOTTLES DRAWN AEROBIC AND ANAEROBIC   Culture   Final    NO GROWTH 3 DAYS Performed at [facility], [address], [place], [place] [address]    Report Status PENDING  Incomplete  Culture, blood (Routine x 2)     Status: None (Preliminary result)   Collection Time: 07/19/21  1:16 PM   Specimen: BLOOD LEFT HAND  Result Value Ref Range Status   Specimen Description BLOOD LEFT HAND  Final   Special Requests   Final    Blood Culture adequate volume BOTTLES DRAWN AEROBIC AND ANAEROBIC   Culture   Final    NO GROWTH 3 DAYS Performed at [facility], [address] [place], [place] [address]    Report Status PENDING  Incomplete  Resp Panel by RT-PCR (Flu A&B, Covid) Nasopharyngeal Swab     Status: Abnormal   Collection Time: 07/19/21  1:20 PM   Specimen: Nasopharyngeal Swab; Nasopharyngeal(NP) swabs in vial transport medium  Result Value Ref Range Status   SARS Coronavirus 2 by RT PCR POSITIVE (A) NEGATIVE Final    Comment: WHITE,M AT 1550 ON 10.18.22 BY RUCINSKI,B (NOTE) SARS-CoV-2 target nucleic acids  are DETECTED.  The SARS-CoV-2 RNA is generally detectable in upper respiratory specimens  during the acute phase of infection. Positive results are indicative of the presence of the identified virus, but do not rule out bacterial infection or co-infection with other pathogens not detected by the test. Clinical correlation with patient history and other diagnostic information is necessary to determine patient infection status. The expected result is Negative.  Fact Sheet for Patients: BloggerCourse.com  Fact Sheet for Healthcare Providers: SeriousBroker.it  This test is not yet approved or cleared by the [place] FDA and  has been authorized for detection and/or diagnosis of SARS-CoV-2 by FDA under an Emergency Use Authorization (EUA).  This EUA will remain in effect (meaning this test can be used) for the duration of  the COVID-19 d eclaration under Section 564(b)(1) of the Act, 21 U.S.C. section 360bbb-3(b)(1), unless the authorization is terminated or revoked sooner.     Influenza A by PCR NEGATIVE NEGATIVE Final   Influenza B by PCR NEGATIVE NEGATIVE Final    Comment: (NOTE) The Xpert Xpress SARS-CoV-2/FLU/RSV plus assay is intended as an aid in the diagnosis of influenza from Nasopharyngeal swab specimens and should not be used as a sole basis for treatment. Nasal washings and aspirates are unacceptable for Xpert Xpress SARS-CoV-2/FLU/RSV testing.  Fact Sheet for Patients: BloggerCourse.com  Fact Sheet for Healthcare Providers: SeriousBroker.it  This test is not yet approved or cleared by the [place] FDA and has been authorized for detection and/or diagnosis of SARS-CoV-2 by FDA under an Emergency Use Authorization (EUA). This EUA will remain in effect (meaning this test can be used) for the duration of the COVID-19 declaration under Section 564(b)(1) of the Act, 21 U.S.C. section 360bbb-3(b)(1), unless the authorization is terminated  or revoked.  Performed at [facility], [address] [place], [place] [address]          Radiology Studies: No results found.      Scheduled Meds:  albuterol  2 puff Inhalation Q4H   vitamin C  500 mg Oral Daily   chlorpheniramine-HYDROcodone  5 mL Oral Q12H   [START ON 07/23/2021] dexamethasone  6 mg Oral Daily   diphenhydrAMINE  50 mg Intravenous Once   doxycycline  100 mg Oral Q12H   DULoxetine  60 mg Oral BID   fluticasone furoate-vilanterol  1 puff Inhalation Daily   guaiFENesin-dextromethorphan  10 mL Oral Q8H   heparin  5,000 Units Subcutaneous Q8H   metoprolol succinate  50 mg Oral Daily   morphine  30 mg Oral BID   nicotine  14 mg Transdermal Daily   potassium chloride SA  40 mEq Oral BID   sodium chloride flush  3 mL Intravenous Q12H   sodium chloride flush  3 mL Intravenous Q12H   zinc sulfate  220 mg Oral Daily   Continuous Infusions:  sodium chloride     remdesivir 100 mg in NS 100 mL 100 mg (07/22/21 1031)     LOS: 3 days    Time spent: 32 minutes    Dorcas Carrow, MD [facility]  If 7PM-7AM, please contact night-coverage [email or web address] 07/22/2021, 11:50 AM

## 2021-07-22 NOTE — TOC Initial Note (Signed)
Transition of Care [facility]) - Initial/Assessment Note    Patient Details  Name: Renee Oliver MRN: [id number] Date of Birth: 1956-08-08  Transition of Care [facility] CM/SW Contact:    Barry Brunner, LCSW Phone Number: 07/22/2021, 3:42 PM  Clinical Narrative:                 Patient is a 65 year old female admitted for SIRS (systemic inflammatory response syndrome) (HCC). CSW conducted intial assessment. Patient lives at home with spouse. Patient declined [facility] services and SNF. Patient reported that her husband could assist with ambulation and provide assistance with completing ADL's once discharged. TOC to follow.   Expected Discharge Plan: Home/Self Care Barriers to Discharge: Continued Medical Work up   Patient Goals and CMS Choice Patient states their goals for this hospitalization and ongoing recovery are:: Return home CMS Medicare.gov Compare Post Acute Care list provided to:: Patient Choice offered to / list presented to : Patient  Expected Discharge Plan and Services Expected Discharge Plan: Home/Self Care   Discharge Planning Services: CM Consult   Living arrangements for the past 2 months: Single Family Home                                      Prior Living Arrangements/Services Living arrangements for the past 2 months: Single Family Home Lives with:: Self, Spouse Patient language and need for interpreter reviewed:: Yes Do you feel safe going back to the place where you live?: Yes      Need for Family Participation in Patient Care: Yes (Comment) Care giver support system in place?: Yes (comment)   Criminal Activity/Legal Involvement Pertinent to Current Situation/Hospitalization: No - Comment as needed  Activities of Daily Living Home Assistive Devices/Equipment: None ADL Screening (condition at time of admission) Patient's cognitive ability adequate to safely complete daily activities?: Yes Is the patient deaf or have difficulty hearing?: No Does the  patient have difficulty seeing, even when wearing glasses/contacts?: No Does the patient have difficulty concentrating, remembering, or making decisions?: No Patient able to express need for assistance with ADLs?: Yes Does the patient have difficulty dressing or bathing?: No Independently performs ADLs?: Yes (appropriate for developmental age) Does the patient have difficulty walking or climbing stairs?: No Weakness of Legs: Both Weakness of Arms/Hands: None  Permission Sought/Granted Permission sought to share information with : Family Supports    Share Information with NAME: Baldwin Crown     Permission granted to share info w Relationship: (Significant other)  Permission granted to share info w Contact Information: [phone]  Emotional Assessment Appearance:: Appears stated age   Affect (typically observed): Accepting, Adaptable Orientation: : Oriented to Self, Oriented to Situation, Oriented to Place, Oriented to  Time Alcohol / Substance Use: Not Applicable Psych Involvement: No (comment)  Admission diagnosis:  Hypoxia [R09.02] SIRS (systemic inflammatory response syndrome) (HCC) [R65.10] Patient Active Problem List   Diagnosis Date Noted   SIRS (systemic inflammatory response syndrome) (HCC) 07/19/2021   PNA (pneumonia) 07/19/2021    Class: Acute   Acute respiratory failure with hypoxia and hypercapnia (HCC) 07/19/2021    Class: Acute   COVID-19 virus infection 07/19/2021   Palpitation 09/12/2019   Essential hypertension 09/12/2019   Paroxysmal SVT (supraventricular tachycardia) (HCC) 09/12/2019   PCP:  Mitzi Hansen, NP Pharmacy:   [facility] [phone] - [place], [place] - [address] [place] [address] Phone: [phone] Fax: [phone]     Social Determinants of Health (SDOH) Interventions    Readmission Risk Interventions No flowsheet data found.

## 2021-07-22 NOTE — Care Management Important Message (Signed)
Important Message  Patient Details  Name: Renee Oliver MRN: [id number] Date of Birth: 22-Jul-1956   Medicare Important Message Given:  Yes     Corey Harold 07/22/2021, 3:58 PM

## 2021-07-22 NOTE — Progress Notes (Signed)
Patient is asleep resting comfortably did not awaken for inhaler.

## 2021-07-23 DIAGNOSIS — R6510 Systemic inflammatory response syndrome (SIRS) of non-infectious origin without acute organ dysfunction: Secondary | ICD-10-CM | POA: Diagnosis not present

## 2021-07-23 LAB — CBC
HCT: 43 % (ref 36.0–46.0)
Hemoglobin: 14.2 g/dL (ref 12.0–15.0)
MCH: 35.8 pg — ABNORMAL HIGH (ref 26.0–34.0)
MCHC: 33 g/dL (ref 30.0–36.0)
MCV: 108.3 fL — ABNORMAL HIGH (ref 80.0–100.0)
Platelets: 419 10*3/uL — ABNORMAL HIGH (ref 150–400)
RBC: 3.97 MIL/uL (ref 3.87–5.11)
RDW: 13.3 % (ref 11.5–15.5)
WBC: 15.3 10*3/uL — ABNORMAL HIGH (ref 4.0–10.5)
nRBC: 0 % (ref 0.0–0.2)

## 2021-07-23 LAB — BASIC METABOLIC PANEL
Anion gap: 7 (ref 5–15)
BUN: 15 mg/dL (ref 8–23)
CO2: 25 mmol/L (ref 22–32)
Calcium: 8.8 mg/dL — ABNORMAL LOW (ref 8.9–10.3)
Chloride: 108 mmol/L (ref 98–111)
Creatinine, Ser: 0.59 mg/dL (ref 0.44–1.00)
GFR, Estimated: >60 mL/min (ref >60)
Glucose, Bld: 84 mg/dL (ref 70–99)
Potassium: 4 mmol/L (ref 3.5–5.1)
Sodium: 140 mmol/L (ref 135–145)

## 2021-07-23 LAB — GLUCOSE, CAPILLARY: Glucose-Capillary: 82 mg/dL (ref 70–99)

## 2021-07-23 LAB — D-DIMER, QUANTITATIVE: D-Dimer, Quant: 2.89 ug/mL-FEU — ABNORMAL HIGH (ref 0.00–0.50)

## 2021-07-23 NOTE — Progress Notes (Signed)
PROGRESS NOTE    Renee Oliver  ZDG:[id number] DOB: 1956-07-06 DOA: 07/19/2021 PCP: Mitzi Hansen, NP   Brief Narrative:  Renee Oliver is a 65 y.o. female with medical history significant of with history of COPD, depression, hypertension, anxiety and smoker presented to ED with chief complaint of shortness of breath, and palpitation.  Reporting of nonproductive cough for past 6 days progressively getting worse.  Patient was admitted with acute hypoxemic respiratory failure secondary to COVID-pneumonia and COPD exacerbation.  She continues to have ongoing wheezing and shortness of breath.  Assessment & Plan:   Principal Problem:   SIRS (systemic inflammatory response syndrome) (HCC) Active Problems:   Palpitation   Essential hypertension   PNA (pneumonia)   Acute respiratory failure with hypoxia and hypercapnia (HCC)   COVID-19 virus infection   Acute hypoxemic respiratory failure secondary to COPD exacerbation.  COPD exacerbation likely due to COVID-19 virus infection.   Continue to monitor due to significant symptoms.  Patient is still on 2 L oxygen.   chest physiotherapy, incentive spirometry, deep breathing exercises, sputum induction, mucolytic's and bronchodilators. Supplemental oxygen to keep saturations more than 90%. Covid directed therapy with , steroids, Solu-Medrol IV.  Minor allergy to prednisone.  Will change to dexamethasone oral by tomorrow. remdesivir, day 4/5. Initially on antibiotics that was discontinued.  Though patient has COVID-19, her symptomatology likely related to COPD exacerbation.  No evidence of consolidation on chest x-ray.  She has a change in her sputum.  We will treat with doxycycline for 7 days. Due to severity of symptoms, patient will need daily inflammatory markers, chest x-rays, liver function test to monitor and direct COVID-19 therapies.  Her D-dimer is significantly elevated.  Patient is high risk of pulmonary embolism with acute COVID  infection, COPD.  CTA chest without any sign of PE.   Essential hypertension -Holding home blood pressure medications -IV hydralazine as needed   History of COPD -Continue steroids and breathing treatments as needed.  Patient is on Symbicort at home but not on any albuterol.  Will prescribe albuterol on discharge.   Depression -Continue Cymbalta   Chronic pain syndrome -Patient is on chronic pain management with MS Contin 30 mg twice a day along with Percocet 10/325 5 times a day.  Currently on home doses of opiates.    Smoker: Ongoing smoker.  Nicotine patch provided.  Poor prognosis with ongoing smoking and advanced COPD.   DVT prophylaxis: Heparin Code Status: Full Family Communication: None. Disposition Plan:  Status is: Inpatient   Remains inpatient appropriate because: Requiring oxygen, monitoring for COVID-19 infection.  IV antiviral treatment.     Consultants:  None   Procedures:  See below  Antimicrobials:  Anti-infectives (From admission, onward)    Start     Dose/Rate Route Frequency Ordered Stop   07/21/21 1215  doxycycline (VIBRA-TABS) tablet 100 mg        100 mg Oral Every 12 hours 07/21/21 1115     07/20/21 1000  remdesivir 100 mg in sodium chloride 0.9 % 100 mL IVPB        100 mg 200 mL/hr over 30 Minutes Intravenous Daily 07/19/21 1600 07/24/21 0959   07/20/21 0600  levofloxacin (LEVAQUIN) IVPB 750 mg  Status:  Discontinued        750 mg 100 mL/hr over 90 Minutes Intravenous  Once 07/19/21 1451 07/19/21 1502   07/19/21 1630  remdesivir 100 mg in sodium chloride 0.9 % 100 mL IVPB  100 mg 200 mL/hr over 30 Minutes Intravenous Every 1 hr x 2 07/19/21 1600 07/19/21 1836   07/19/21 1530  cefTRIAXone (ROCEPHIN) 1 g in sodium chloride 0.9 % 100 mL IVPB  Status:  Discontinued        1 g 200 mL/hr over 30 Minutes Intravenous Every 24 hours 07/19/21 1502 07/20/21 0737   07/19/21 1315  azithromycin (ZITHROMAX) 500 mg in sodium chloride 0.9 % 250 mL IVPB   Status:  Discontinued        500 mg 250 mL/hr over 60 Minutes Intravenous Every 24 hours 07/19/21 1309 07/20/21 0737       Subjective: Patient seen and evaluated today with ongoing shortness of breath and wheezing noted.  She remains on 2 L nasal cannula oxygen.  Objective: Vitals:   07/22/21 2037 07/23/21 0600 07/23/21 0604 07/23/21 0850  BP:   (!) 162/89   Pulse:   65   Resp:   18   Temp:   97.8 F (36.6 C)   TempSrc:   Oral   SpO2: 98%  99% 96%  Weight:  51.6 kg    Height:        Intake/Output Summary (Last 24 hours) at 07/23/2021 1209 Last data filed at 07/23/2021 0530 Gross per 24 hour  Intake 340 ml  Output 400 ml  Net -60 ml   Filed Weights   07/21/21 0500 07/22/21 0500 07/23/21 0600  Weight: 54.7 kg 54.9 kg 51.6 kg    Examination:  General exam: Appears calm and comfortable  Respiratory system: Ongoing wheezing bilaterally.  2 L nasal cannula. Cardiovascular system: S1 & S2 heard, RRR.  Gastrointestinal system: Abdomen is soft Central nervous system: Alert and awake Extremities: No edema Skin: No significant lesions noted Psychiatry: Flat affect.    Data Reviewed: I have personally reviewed following labs and imaging studies  CBC: Recent Labs  Lab 07/19/21 1305 07/19/21 1526 07/20/21 0352 07/21/21 0606 07/22/21 0552 07/23/21 0539  WBC 12.5* 12.4* 8.6 11.3* 13.8* 15.3*  NEUTROABS 8.7*  --   --   --   --   --   HGB 16.8* 15.5* 13.8 14.6 14.1 14.2  HCT 48.8* 46.6* 40.5 42.9 41.7 43.0  MCV 104.1* 107.9* 105.7* 107.3* 107.8* 108.3*  PLT 310 252 301 357 374 419*   Basic Metabolic Panel: Recent Labs  Lab 07/19/21 1305 07/19/21 1526 07/20/21 0352 07/21/21 0606 07/22/21 0552 07/23/21 0539  NA 132*  --  135 138 138 140  K 3.1*  --  2.8* 4.2 4.8 4.0  CL 92*  --  104 112* 108 108  CO2 29  --  23 20* 23 25  GLUCOSE 112*  --  88 136* 131* 84  BUN 22  --  15 18 16 15   CREATININE 0.91 0.75 0.53 0.60 0.49 0.59  CALCIUM 8.5*  --  8.0* 8.9 8.7*  8.8*  MG  --   --  1.7 1.6* 1.8  --   PHOS  --   --   --   --  2.8  --    GFR: Estimated Creatinine Clearance: 55.4 mL/min (by C-G formula based on SCr of 0.59 mg/dL). Liver Function Tests: Recent Labs  Lab 07/19/21 1305  AST 36  ALT 42  ALKPHOS 135*  BILITOT 0.7  PROT 7.1  ALBUMIN 3.0*   No results for input(s): LIPASE, AMYLASE in the last 168 hours. No results for input(s): AMMONIA in the last 168 hours. Coagulation Profile: Recent Labs  Lab 07/19/21  1305 07/20/21 0352  INR 1.0 1.1   Cardiac Enzymes: No results for input(s): CKTOTAL, CKMB, CKMBINDEX, TROPONINI in the last 168 hours. BNP (last 3 results) No results for input(s): PROBNP in the last 8760 hours. HbA1C: No results for input(s): HGBA1C in the last 72 hours. CBG: Recent Labs  Lab 07/20/21 0933 07/21/21 0739 07/22/21 0718 07/23/21 0754  GLUCAP 117* 131* 128* 82   Lipid Profile: No results for input(s): CHOL, HDL, LDLCALC, TRIG, CHOLHDL, LDLDIRECT in the last 72 hours. Thyroid Function Tests: No results for input(s): TSH, T4TOTAL, FREET4, T3FREE, THYROIDAB in the last 72 hours. Anemia Panel: No results for input(s): VITAMINB12, FOLATE, FERRITIN, TIBC, IRON, RETICCTPCT in the last 72 hours. Sepsis Labs: Recent Labs  Lab 07/19/21 1301 07/19/21 1526 07/19/21 1701 07/19/21 2033  PROCALCITON  --  <0.10  --   --   LATICACIDVEN 1.4 3.1* 1.2 1.1    Recent Results (from the past 240 hour(s))  MRSA Next Gen by PCR, Nasal     Status: None   Collection Time: 07/19/21 10:36 AM   Specimen: Nasal Mucosa; Nasal Swab  Result Value Ref Range Status   MRSA by PCR Next Gen NOT DETECTED NOT DETECTED Final    Comment: (NOTE) The GeneXpert MRSA Assay (FDA approved for NASAL specimens only), is one component of a comprehensive MRSA colonization surveillance program. It is not intended to diagnose MRSA infection nor to guide or monitor treatment for MRSA infections. Test performance is not FDA approved in  patients less than 51 years old. Performed at [facility], [address], [place], [place] [address]   Culture, blood (Routine x 2)     Status: None (Preliminary result)   Collection Time: 07/19/21  1:16 PM   Specimen: BLOOD RIGHT FOREARM  Result Value Ref Range Status   Specimen Description BLOOD RIGHT FOREARM  Final   Special Requests   Final    Blood Culture results may not be optimal due to an inadequate volume of blood received in culture bottles BOTTLES DRAWN AEROBIC AND ANAEROBIC   Culture   Final    NO GROWTH 4 DAYS Performed at [facility], [address] [place], [place] [address]    Report Status PENDING  Incomplete  Culture, blood (Routine x 2)     Status: None (Preliminary result)   Collection Time: 07/19/21  1:16 PM   Specimen: BLOOD LEFT HAND  Result Value Ref Range Status   Specimen Description BLOOD LEFT HAND  Final   Special Requests   Final    Blood Culture adequate volume BOTTLES DRAWN AEROBIC AND ANAEROBIC   Culture   Final    NO GROWTH 4 DAYS Performed at [facility], [address] [place], [place] [address]    Report Status PENDING  Incomplete  Resp Panel by RT-PCR (Flu A&B, Covid) Nasopharyngeal Swab     Status: Abnormal   Collection Time: 07/19/21  1:20 PM   Specimen: Nasopharyngeal Swab; Nasopharyngeal(NP) swabs in vial transport medium  Result Value Ref Range Status   SARS Coronavirus 2 by RT PCR POSITIVE (A) NEGATIVE Final    Comment: WHITE,M AT 1550 ON 10.18.22 BY RUCINSKI,B (NOTE) SARS-CoV-2 target nucleic acids are DETECTED.  The SARS-CoV-2 RNA is generally detectable in upper respiratory specimens during the acute phase of infection. Positive results are indicative of the presence of the identified virus, but do not rule out bacterial infection or co-infection with other pathogens not detected by the test. Clinical correlation with patient history and  other diagnostic information is necessary to determine patient infection status. The  expected result is Negative.  Fact Sheet for Patients: BloggerCourse.com  Fact Sheet for Healthcare Providers: SeriousBroker.it  This test is not yet approved or cleared by the [place] FDA and  has been authorized for detection and/or diagnosis of SARS-CoV-2 by FDA under an Emergency Use Authorization (EUA).  This EUA will remain in effect (meaning this test can be used) for the duration of  the COVID-19 d eclaration under Section 564(b)(1) of the Act, 21 U.S.C. section 360bbb-3(b)(1), unless the authorization is terminated or revoked sooner.     Influenza A by PCR NEGATIVE NEGATIVE Final   Influenza B by PCR NEGATIVE NEGATIVE Final    Comment: (NOTE) The Xpert Xpress SARS-CoV-2/FLU/RSV plus assay is intended as an aid in the diagnosis of influenza from Nasopharyngeal swab specimens and should not be used as a sole basis for treatment. Nasal washings and aspirates are unacceptable for Xpert Xpress SARS-CoV-2/FLU/RSV testing.  Fact Sheet for Patients: BloggerCourse.com  Fact Sheet for Healthcare Providers: SeriousBroker.it  This test is not yet approved or cleared by the [place] FDA and has been authorized for detection and/or diagnosis of SARS-CoV-2 by FDA under an Emergency Use Authorization (EUA). This EUA will remain in effect (meaning this test can be used) for the duration of the COVID-19 declaration under Section 564(b)(1) of the Act, 21 U.S.C. section 360bbb-3(b)(1), unless the authorization is terminated or revoked.  Performed at [facility], [address] [place], [place] [address]          Radiology Studies: CT Angio Chest Pulmonary Embolism (PE) W or WO Contrast  Result Date: 07/22/2021 CLINICAL DATA:  PE suspected.  High probability. EXAM: CT ANGIOGRAPHY CHEST WITH CONTRAST TECHNIQUE: Multidetector CT imaging of the chest was performed using  the standard protocol during bolus administration of intravenous contrast. Multiplanar CT image reconstructions and MIPs were obtained to evaluate the vascular anatomy. CONTRAST:  75mL OMNIPAQUE IOHEXOL 350 MG/ML SOLN COMPARISON:  Chest radiograph, 07/19/2021. CT chest report, 08/11/1999. FINDINGS: Cardiovascular: Satisfactory opacification of the pulmonary arteries to the segmental level. No segmental or larger pulmonary embolus. Normal heart size. No pericardial effusion. Mediastinum/Nodes: No enlarged mediastinal, hilar, or axillary lymph nodes. Thyroid gland, trachea, and esophagus demonstrate no significant findings. Lungs/Pleura: *Coarse pulmonary interstitial septal thickening and ground-glass opacities with a "crazy paving" appearance, predominantly involving the RIGHT upper lobe and to a minor extent the LEFT upper, LEFT lower and RIGHT lower lobes. *Mild background of apical predominant emphysematous lung change. *Small volume bilateral pleural effusions, with adjacent compressive lung. No pneumothorax. Upper Abdomen: No acute abnormality. Musculoskeletal: No chest wall abnormality. No acute or significant osseous findings. Review of the MIP images confirms the above findings. IMPRESSION: 1. No segmental or larger pulmonary embolus. 2. RIGHT upper lobe dominant pulmonary opacities with a "crazy paving" appearance. Findings most commonly seen with pneumonia 3. Small volume bilateral pleural effusions. Electronically Signed   By: Roanna Banning M.D.   On: 07/22/2021 15:31        Scheduled Meds:  albuterol  2 puff Inhalation Q4H WA   chlorpheniramine-HYDROcodone  5 mL Oral Q12H   dexamethasone  6 mg Oral Daily   doxycycline  100 mg Oral Q12H   DULoxetine  60 mg Oral BID   fluticasone furoate-vilanterol  1 puff Inhalation Daily   guaiFENesin-dextromethorphan  10 mL Oral Q8H   heparin  5,000 Units Subcutaneous Q8H   metoprolol succinate  50 mg Oral Daily  morphine  30 mg Oral BID   nicotine  14  mg Transdermal Daily   potassium chloride SA  40 mEq Oral BID   sodium chloride flush  3 mL Intravenous Q12H   sodium chloride flush  3 mL Intravenous Q12H   Continuous Infusions:  sodium chloride     remdesivir 100 mg in NS 100 mL 100 mg (07/23/21 1034)     LOS: 4 days    Time spent: 35 minutes    Teneil Shiller Hoover Brunette, DO [facility]  If 7PM-7AM, please contact night-coverage [email or web address] 07/23/2021, 12:09 PM

## 2021-07-24 DIAGNOSIS — R6510 Systemic inflammatory response syndrome (SIRS) of non-infectious origin without acute organ dysfunction: Secondary | ICD-10-CM | POA: Diagnosis not present

## 2021-07-24 LAB — GLUCOSE, CAPILLARY: Glucose-Capillary: 82 mg/dL (ref 70–99)

## 2021-07-24 LAB — BASIC METABOLIC PANEL
Anion gap: 8 (ref 5–15)
BUN: 12 mg/dL (ref 8–23)
CO2: 24 mmol/L (ref 22–32)
Calcium: 8.3 mg/dL — ABNORMAL LOW (ref 8.9–10.3)
Chloride: 103 mmol/L (ref 98–111)
Creatinine, Ser: 0.45 mg/dL (ref 0.44–1.00)
GFR, Estimated: >60 mL/min (ref >60)
Glucose, Bld: 87 mg/dL (ref 70–99)
Potassium: 4 mmol/L (ref 3.5–5.1)
Sodium: 135 mmol/L (ref 135–145)

## 2021-07-24 LAB — D-DIMER, QUANTITATIVE: D-Dimer, Quant: 2.44 ug/mL-FEU — ABNORMAL HIGH (ref 0.00–0.50)

## 2021-07-24 LAB — CULTURE, BLOOD (ROUTINE X 2)
Culture: NO GROWTH
Culture: NO GROWTH
Special Requests: ADEQUATE

## 2021-07-24 LAB — CBC
HCT: 41.5 % (ref 36.0–46.0)
Hemoglobin: 14.2 g/dL (ref 12.0–15.0)
MCH: 36.3 pg — ABNORMAL HIGH (ref 26.0–34.0)
MCHC: 34.2 g/dL (ref 30.0–36.0)
MCV: 106.1 fL — ABNORMAL HIGH (ref 80.0–100.0)
Platelets: 394 10*3/uL (ref 150–400)
RBC: 3.91 MIL/uL (ref 3.87–5.11)
RDW: 13.1 % (ref 11.5–15.5)
WBC: 10.4 10*3/uL (ref 4.0–10.5)
nRBC: 0 % (ref 0.0–0.2)

## 2021-07-24 LAB — MAGNESIUM: Magnesium: 1.6 mg/dL — ABNORMAL LOW (ref 1.7–2.4)

## 2021-07-24 MED ORDER — ALBUTEROL SULFATE HFA 108 (90 BASE) MCG/ACT IN AERS: 2.0000 | INHALATION_SPRAY | Freq: Four times a day (QID) | RESPIRATORY_TRACT | 1 refills | Status: DC | PRN

## 2021-07-24 MED ORDER — DEXAMETHASONE 6 MG PO TABS: 6.0000 mg | ORAL_TABLET | Freq: Every day | ORAL | 0 refills | Status: AC

## 2021-07-24 MED ORDER — ALBUTEROL SULFATE HFA 108 (90 BASE) MCG/ACT IN AERS: 2.0000 | INHALATION_SPRAY | Freq: Four times a day (QID) | RESPIRATORY_TRACT | Status: DC

## 2021-07-24 MED ORDER — GUAIFENESIN-DM 100-10 MG/5ML PO SYRP: 10.0000 mL | ORAL_SOLUTION | Freq: Three times a day (TID) | ORAL | 0 refills | Status: DC

## 2021-07-24 MED ORDER — MAGNESIUM SULFATE 2 GM/50ML IV SOLN: 2.0000 g | Freq: Once | INTRAVENOUS | Status: DC

## 2021-07-24 MED ORDER — DOXYCYCLINE HYCLATE 100 MG PO TABS: 100.0000 mg | ORAL_TABLET | Freq: Two times a day (BID) | ORAL | 0 refills | Status: AC

## 2021-07-24 NOTE — TOC Transition Note (Signed)
Transition of Care [facility]) - CM/SW Discharge Note   Patient Details  Name: Renee Oliver MRN: [id number] Date of Birth: [birth year]  Transition of [facility]) CM/SW Contact:  Barry Brunner, LCSW Phone Number: 07/24/2021, 11:58 AM   Clinical Narrative:    CSW notified of patient's readiness for discharge and dme needs. CSW placed referral for a walker with Morrie Sheldon from [employer]. Morrie Sheldon agreeable to provide walker. TOC signing off.    Final next level of care: Home/Self Care Barriers to Discharge: Barriers Resolved   Patient Goals and CMS Choice Patient states their goals for this hospitalization and ongoing recovery are:: Return home CMS Medicare.gov Compare Post Acute Care list provided to:: Patient Choice offered to / list presented to : Patient  Discharge Placement                       Discharge Plan and Services   Discharge Planning Services: CM Consult            DME Arranged: Dan Humphreys DME Agency: [employer] Date DME Agency Contacted: 07/24/21 Time DME Agency Contacted: [phone] Representative spoke with at DME Agency: Morrie Sheldon            Social Determinants of Health (SDOH) Interventions     Readmission Risk Interventions No flowsheet data found.

## 2021-07-24 NOTE — Discharge Summary (Addendum)
Physician Discharge Summary  Renee Oliver:[id number] DOB: Dec 20, 1955 DOA: 07/19/2021  PCP: Mitzi Hansen, NP  Admit date: 07/19/2021  Discharge date: 07/24/2021  Admitted From:Home  Disposition:  Home  Recommendations for Outpatient Follow-up:  Follow up with PCP in 1-2 weeks Continue doxycycline and dexamethasone as prescribed for few more days to complete course of treatment Home inhaler provided as needed for shortness of breath or wheezing Continue other home medications as prior  Home Health: Declined home health  Equipment/Devices: Home Walker  Discharge Condition:Stable  CODE STATUS: Full  Diet recommendation: Heart Healthy  Brief/Interim Summary:  Renee Oliver is a 65 y.o. female with medical history significant of with history of COPD, depression, hypertension, anxiety and smoker presented to ED with chief complaint of shortness of breath, and palpitation.  Reporting of nonproductive cough for past 6 days progressively getting worse.  Patient was admitted with acute hypoxemic respiratory failure secondary to COVID-pneumonia and COPD exacerbation.  She no longer has any ongoing wheezing or shortness of breath and has been successfully treated with IV remdesivir and dexamethasone.  She was also on doxycycline for her COPD exacerbation.  She no longer has an oxygen requirement and has been seen by PT/OT with recommendations for home walker as well as PT/OT-which patient has declined.  She is in stable condition for discharge today and is eager to go home.  No other acute events noted throughout the course of this admission.  Discharge Diagnoses:  Principal Problem:   SIRS (systemic inflammatory response syndrome) (HCC) Active Problems:   Palpitation   Essential hypertension   PNA (pneumonia)   Acute respiratory failure with hypoxia and hypercapnia (HCC)   COVID-19 virus infection  Principal discharge diagnosis: Acute hypoxemic respiratory failure secondary to  COPD exacerbation in the setting of COVID-19 pneumonia.  Discharge Instructions  Discharge Instructions     Diet - low sodium heart healthy   Complete by: As directed    Increase activity slowly   Complete by: As directed       Allergies as of 07/24/2021       Reactions   Amoxicillin Nausea And Vomiting   Can take penicillin but Amoxicillin makes her have upset stomach   Aspirin Nausea And Vomiting, Other (See Comments)   Heart palpations   Nsaids Nausea And Vomiting   Prednisone Other (See Comments)   Hyper  Mental changes   Iodinated Diagnostic Agents Swelling, Rash   Throat swelling        Medication List     STOP taking these medications    hydrochlorothiazide 12.5 MG tablet Commonly known as: HYDRODIURIL   losartan 100 MG tablet Commonly known as: COZAAR       TAKE these medications    albuterol 108 (90 Base) MCG/ACT inhaler Commonly known as: VENTOLIN HFA Inhale 2 puffs into the lungs every 6 (six) hours as needed for wheezing or shortness of breath.   baclofen 10 MG tablet Commonly known as: LIORESAL Take 10 mg by mouth 3 (three) times daily as needed.   budesonide-formoterol 160-4.5 MCG/ACT inhaler Commonly known as: SYMBICORT Inhale 2 puffs into the lungs 2 (two) times daily.   dexamethasone 6 MG tablet Commonly known as: DECADRON Take 1 tablet (6 mg total) by mouth daily for 4 days.   doxycycline 100 MG tablet Commonly known as: VIBRA-TABS Take 1 tablet (100 mg total) by mouth every 12 (twelve) hours for 3 days.   DULoxetine 60 MG capsule Commonly known as: CYMBALTA Take 60  mg by mouth 2 (two) times daily.   guaiFENesin-dextromethorphan 100-10 MG/5ML syrup Commonly known as: ROBITUSSIN DM Take 10 mLs by mouth every 8 (eight) hours.   loperamide 2 MG capsule Commonly known as: IMODIUM Take 2 mg by mouth as needed for diarrhea or loose stools.   losartan-hydrochlorothiazide 100-12.5 MG tablet Commonly known as: HYZAAR Take 1  tablet by mouth daily.   metoprolol succinate 50 MG 24 hr tablet Commonly known as: TOPROL-XL Take 50 mg by mouth daily. What changed: Another medication with the same name was removed. Continue taking this medication, and follow the directions you see here.   morphine 30 MG 12 hr tablet Commonly known as: MS CONTIN Take 30 mg by mouth 2 (two) times daily.   oxyCODONE-acetaminophen 10-325 MG tablet Commonly known as: PERCOCET Take 1 tablet by mouth 3 (three) times daily.   pramipexole 0.25 MG tablet Commonly known as: MIRAPEX Take 0.25 mg by mouth at bedtime.   traZODone 50 MG tablet Commonly known as: DESYREL Take 50 mg by mouth at bedtime as needed.   venlafaxine XR 37.5 MG 24 hr capsule Commonly known as: EFFEXOR-XR Take 37.5 mg by mouth daily.               Durable Medical Equipment  (From admission, onward)           Start     Ordered   07/24/21 1026  DME Walker  Once       Question Answer Comment  Walker: With 5 Inch Wheels   Patient needs a walker to treat with the following condition Weakness      07/24/21 1026            Follow-up Information     Mitzi Hansen, NP. Schedule an appointment as soon as possible for a visit in 1 week(s).   Specialty: Nurse Practitioner Contact information: [address] [place] [address] [phone]                Allergies  Allergen Reactions   Amoxicillin Nausea And Vomiting    Can take penicillin but Amoxicillin makes her have upset stomach   Aspirin Nausea And Vomiting and Other (See Comments)    Heart palpations    Nsaids Nausea And Vomiting   Prednisone Other (See Comments)    Hyper  Mental changes   Iodinated Diagnostic Agents Swelling and Rash    Throat swelling    Consultations: None   Procedures/Studies: CT Angio Chest Pulmonary Embolism (PE) W or WO Contrast  Result Date: 07/22/2021 CLINICAL DATA:  PE suspected.  High probability. EXAM: CT ANGIOGRAPHY CHEST WITH CONTRAST  TECHNIQUE: Multidetector CT imaging of the chest was performed using the standard protocol during bolus administration of intravenous contrast. Multiplanar CT image reconstructions and MIPs were obtained to evaluate the vascular anatomy. CONTRAST:  73mL OMNIPAQUE IOHEXOL 350 MG/ML SOLN COMPARISON:  Chest radiograph, 07/19/2021. CT chest report, 08/11/1999. FINDINGS: Cardiovascular: Satisfactory opacification of the pulmonary arteries to the segmental level. No segmental or larger pulmonary embolus. Normal heart size. No pericardial effusion. Mediastinum/Nodes: No enlarged mediastinal, hilar, or axillary lymph nodes. Thyroid gland, trachea, and esophagus demonstrate no significant findings. Lungs/Pleura: *Coarse pulmonary interstitial septal thickening and ground-glass opacities with a "crazy paving" appearance, predominantly involving the RIGHT upper lobe and to a minor extent the LEFT upper, LEFT lower and RIGHT lower lobes. *Mild background of apical predominant emphysematous lung change. *Small volume bilateral pleural effusions, with adjacent compressive lung. No pneumothorax. Upper Abdomen: No  acute abnormality. Musculoskeletal: No chest wall abnormality. No acute or significant osseous findings. Review of the MIP images confirms the above findings. IMPRESSION: 1. No segmental or larger pulmonary embolus. 2. RIGHT upper lobe dominant pulmonary opacities with a "crazy paving" appearance. Findings most commonly seen with pneumonia 3. Small volume bilateral pleural effusions. Electronically Signed   By: Roanna Banning M.D.   On: 07/22/2021 15:31   DG Chest Port 1 View  Result Date: 07/19/2021 CLINICAL DATA:  Possible sepsis. EXAM: PORTABLE CHEST 1 VIEW COMPARISON:  Chest x-ray dated September 10, 2009. FINDINGS: The heart size and mediastinal contours are within normal limits. The lungs are hyperinflated. New coarse reticulonodular interstitial opacities at the lung bases. No focal consolidation, pleural  effusion, or pneumothorax. No acute osseous abnormality. IMPRESSION: 1. New coarse reticulonodular interstitial opacities at the lung bases which may reflect atypical infection or smoking-related interstitial lung disease. Electronically Signed   By: Obie Dredge M.D.   On: 07/19/2021 13:54     Discharge Exam: Vitals:   07/24/21 0559 07/24/21 0828  BP: (!) 163/84   Pulse: 73   Resp: 18   Temp: 98.4 F (36.9 C)   SpO2: 94% 91%   Vitals:   07/23/21 2302 07/24/21 0500 07/24/21 0559 07/24/21 0828  BP:   (!) 163/84   Pulse:   73   Resp:   18   Temp:   98.4 F (36.9 C)   TempSrc:   Oral   SpO2: 92%  94% 91%  Weight:  50.8 kg    Height:        General: Pt is alert, awake, not in acute distress Cardiovascular: RRR, S1/S2 +, no rubs, no gallops Respiratory: CTA bilaterally, no wheezing, no rhonchi Abdominal: Soft, NT, ND, bowel sounds + Extremities: no edema, no cyanosis    The results of significant diagnostics from this hospitalization (including imaging, microbiology, ancillary and laboratory) are listed below for reference.     Microbiology: Recent Results (from the past 240 hour(s))  MRSA Next Gen by PCR, Nasal     Status: None   Collection Time: 07/19/21 10:36 AM   Specimen: Nasal Mucosa; Nasal Swab  Result Value Ref Range Status   MRSA by PCR Next Gen NOT DETECTED NOT DETECTED Final    Comment: (NOTE) The GeneXpert MRSA Assay (FDA approved for NASAL specimens only), is one component of a comprehensive MRSA colonization surveillance program. It is not intended to diagnose MRSA infection nor to guide or monitor treatment for MRSA infections. Test performance is not FDA approved in patients less than 98 years old. Performed at [facility], [address], [place], [place] [address]   Culture, blood (Routine x 2)     Status: None   Collection Time: 07/19/21  1:16 PM   Specimen: BLOOD RIGHT FOREARM  Result Value Ref Range Status   Specimen Description BLOOD RIGHT  FOREARM  Final   Special Requests   Final    Blood Culture results may not be optimal due to an inadequate volume of blood received in culture bottles BOTTLES DRAWN AEROBIC AND ANAEROBIC   Culture   Final    NO GROWTH 5 DAYS Performed at [facility], [address], [place], [place] [address]    Report Status 07/24/2021 FINAL  Final  Culture, blood (Routine x 2)     Status: None   Collection Time: 07/19/21  1:16 PM   Specimen: BLOOD LEFT HAND  Result Value Ref Range Status   Specimen Description  BLOOD LEFT HAND  Final   Special Requests   Final    Blood Culture adequate volume BOTTLES DRAWN AEROBIC AND ANAEROBIC   Culture   Final    NO GROWTH 5 DAYS Performed at [facility], [address], [place], [place] [address]    Report Status 07/24/2021 FINAL  Final  Resp Panel by RT-PCR (Flu A&B, Covid) Nasopharyngeal Swab     Status: Abnormal   Collection Time: 07/19/21  1:20 PM   Specimen: Nasopharyngeal Swab; Nasopharyngeal(NP) swabs in vial transport medium  Result Value Ref Range Status   SARS Coronavirus 2 by RT PCR POSITIVE (A) NEGATIVE Final    Comment: WHITE,M AT 1550 ON 10.18.22 BY RUCINSKI,B (NOTE) SARS-CoV-2 target nucleic acids are DETECTED.  The SARS-CoV-2 RNA is generally detectable in upper respiratory specimens during the acute phase of infection. Positive results are indicative of the presence of the identified virus, but do not rule out bacterial infection or co-infection with other pathogens not detected by the test. Clinical correlation with patient history and other diagnostic information is necessary to determine patient infection status. The expected result is Negative.  Fact Sheet for Patients: BloggerCourse.com  Fact Sheet for Healthcare Providers: SeriousBroker.it  This test is not yet approved or cleared by the [place] FDA and  has been authorized for detection and/or diagnosis of SARS-CoV-2 by FDA  under an Emergency Use Authorization (EUA).  This EUA will remain in effect (meaning this test can be used) for the duration of  the COVID-19 d eclaration under Section 564(b)(1) of the Act, 21 U.S.C. section 360bbb-3(b)(1), unless the authorization is terminated or revoked sooner.     Influenza A by PCR NEGATIVE NEGATIVE Final   Influenza B by PCR NEGATIVE NEGATIVE Final    Comment: (NOTE) The Xpert Xpress SARS-CoV-2/FLU/RSV plus assay is intended as an aid in the diagnosis of influenza from Nasopharyngeal swab specimens and should not be used as a sole basis for treatment. Nasal washings and aspirates are unacceptable for Xpert Xpress SARS-CoV-2/FLU/RSV testing.  Fact Sheet for Patients: BloggerCourse.com  Fact Sheet for Healthcare Providers: SeriousBroker.it  This test is not yet approved or cleared by the [place] FDA and has been authorized for detection and/or diagnosis of SARS-CoV-2 by FDA under an Emergency Use Authorization (EUA). This EUA will remain in effect (meaning this test can be used) for the duration of the COVID-19 declaration under Section 564(b)(1) of the Act, 21 U.S.C. section 360bbb-3(b)(1), unless the authorization is terminated or revoked.  Performed at [facility], [address] [place], [place] [address]      Labs: BNP (last 3 results) Recent Labs    07/19/21 1526  BNP 308.0*   Basic Metabolic Panel: Recent Labs  Lab 07/20/21 0352 07/21/21 0606 07/22/21 0552 07/23/21 0539 07/24/21 0456  NA 135 138 138 140 135  K 2.8* 4.2 4.8 4.0 4.0  CL 104 112* 108 108 103  CO2 23 20* 23 25 24   GLUCOSE 88 136* 131* 84 87  BUN 15 18 16 15 12   CREATININE 0.53 0.60 0.49 0.59 0.45  CALCIUM 8.0* 8.9 8.7* 8.8* 8.3*  MG 1.7 1.6* 1.8  --  1.6*  PHOS  --   --  2.8  --   --    Liver Function Tests: Recent Labs  Lab 07/19/21 1305  AST 36  ALT 42  ALKPHOS 135*  BILITOT 0.7  PROT 7.1  ALBUMIN  3.0*   No results for input(s): LIPASE, AMYLASE in the  last 168 hours. No results for input(s): AMMONIA in the last 168 hours. CBC: Recent Labs  Lab 07/19/21 1305 07/19/21 1526 07/20/21 0352 07/21/21 0606 07/22/21 0552 07/23/21 0539 07/24/21 0456  WBC 12.5*   < > 8.6 11.3* 13.8* 15.3* 10.4  NEUTROABS 8.7*  --   --   --   --   --   --   HGB 16.8*   < > 13.8 14.6 14.1 14.2 14.2  HCT 48.8*   < > 40.5 42.9 41.7 43.0 41.5  MCV 104.1*   < > 105.7* 107.3* 107.8* 108.3* 106.1*  PLT 310   < > 301 357 374 419* 394   < > = values in this interval not displayed.   Cardiac Enzymes: No results for input(s): CKTOTAL, CKMB, CKMBINDEX, TROPONINI in the last 168 hours. BNP: Invalid input(s): POCBNP CBG: Recent Labs  Lab 07/20/21 0933 07/21/21 0739 07/22/21 0718 07/23/21 0754 07/24/21 0756  GLUCAP 117* 131* 128* 82 82   D-Dimer Recent Labs    07/23/21 0539 07/24/21 0456  DDIMER 2.89* 2.44*   Hgb A1c No results for input(s): HGBA1C in the last 72 hours. Lipid Profile No results for input(s): CHOL, HDL, LDLCALC, TRIG, CHOLHDL, LDLDIRECT in the last 72 hours. Thyroid function studies No results for input(s): TSH, T4TOTAL, T3FREE, THYROIDAB in the last 72 hours.  Invalid input(s): FREET3 Anemia work up No results for input(s): VITAMINB12, FOLATE, FERRITIN, TIBC, IRON, RETICCTPCT in the last 72 hours. Urinalysis No results found for: COLORURINE, APPEARANCEUR, LABSPEC, PHURINE, GLUCOSEU, HGBUR, BILIRUBINUR, KETONESUR, PROTEINUR, UROBILINOGEN, NITRITE, LEUKOCYTESUR Sepsis Labs Invalid input(s): PROCALCITONIN,  WBC,  LACTICIDVEN Microbiology Recent Results (from the past 240 hour(s))  MRSA Next Gen by PCR, Nasal     Status: None   Collection Time: 07/19/21 10:36 AM   Specimen: Nasal Mucosa; Nasal Swab  Result Value Ref Range Status   MRSA by PCR Next Gen NOT DETECTED NOT DETECTED Final    Comment: (NOTE) The GeneXpert MRSA Assay (FDA approved for NASAL specimens only), is one  component of a comprehensive MRSA colonization surveillance program. It is not intended to diagnose MRSA infection nor to guide or monitor treatment for MRSA infections. Test performance is not FDA approved in patients less than 8 years old. Performed at [facility], [address] [place], [place] [address]   Culture, blood (Routine x 2)     Status: None   Collection Time: 07/19/21  1:16 PM   Specimen: BLOOD RIGHT FOREARM  Result Value Ref Range Status   Specimen Description BLOOD RIGHT FOREARM  Final   Special Requests   Final    Blood Culture results may not be optimal due to an inadequate volume of blood received in culture bottles BOTTLES DRAWN AEROBIC AND ANAEROBIC   Culture   Final    NO GROWTH 5 DAYS Performed at [facility], [address], [place], [place] [address]    Report Status 07/24/2021 FINAL  Final  Culture, blood (Routine x 2)     Status: None   Collection Time: 07/19/21  1:16 PM   Specimen: BLOOD LEFT HAND  Result Value Ref Range Status   Specimen Description BLOOD LEFT HAND  Final   Special Requests   Final    Blood Culture adequate volume BOTTLES DRAWN AEROBIC AND ANAEROBIC   Culture   Final    NO GROWTH 5 DAYS Performed at [facility], [address] [place], [place] [address]    Report Status 07/24/2021 FINAL  Final  Resp Panel by RT-PCR (Flu A&B, Covid) Nasopharyngeal Swab     Status: Abnormal   Collection Time: 07/19/21  1:20 PM   Specimen: Nasopharyngeal Swab; Nasopharyngeal(NP) swabs in vial transport medium  Result Value Ref Range Status   SARS Coronavirus 2 by RT PCR POSITIVE (A) NEGATIVE Final    Comment: WHITE,M AT 1550 ON 10.18.22 BY RUCINSKI,B (NOTE) SARS-CoV-2 target nucleic acids are DETECTED.  The SARS-CoV-2 RNA is generally detectable in upper respiratory specimens during the acute phase of infection. Positive results are indicative of the presence of the identified virus, but do not rule out bacterial infection or co-infection with  other pathogens not detected by the test. Clinical correlation with patient history and other diagnostic information is necessary to determine patient infection status. The expected result is Negative.  Fact Sheet for Patients: BloggerCourse.com  Fact Sheet for Healthcare Providers: SeriousBroker.it  This test is not yet approved or cleared by the [place] FDA and  has been authorized for detection and/or diagnosis of SARS-CoV-2 by FDA under an Emergency Use Authorization (EUA).  This EUA will remain in effect (meaning this test can be used) for the duration of  the COVID-19 d eclaration under Section 564(b)(1) of the Act, 21 U.S.C. section 360bbb-3(b)(1), unless the authorization is terminated or revoked sooner.     Influenza A by PCR NEGATIVE NEGATIVE Final   Influenza B by PCR NEGATIVE NEGATIVE Final    Comment: (NOTE) The Xpert Xpress SARS-CoV-2/FLU/RSV plus assay is intended as an aid in the diagnosis of influenza from Nasopharyngeal swab specimens and should not be used as a sole basis for treatment. Nasal washings and aspirates are unacceptable for Xpert Xpress SARS-CoV-2/FLU/RSV testing.  Fact Sheet for Patients: BloggerCourse.com  Fact Sheet for Healthcare Providers: SeriousBroker.it  This test is not yet approved or cleared by the [place] FDA and has been authorized for detection and/or diagnosis of SARS-CoV-2 by FDA under an Emergency Use Authorization (EUA). This EUA will remain in effect (meaning this test can be used) for the duration of the COVID-19 declaration under Section 564(b)(1) of the Act, 21 U.S.C. section 360bbb-3(b)(1), unless the authorization is terminated or revoked.  Performed at [facility], [address], [place], [place] [address]      Time coordinating discharge: 35 minutes  SIGNED:   Erick Blinks, DO [facility] 07/24/2021, 10:26 AM  If 7PM-7AM, please contact night-coverage [email or web address]

## 2021-07-24 NOTE — Progress Notes (Signed)
Pt refused evening scheduled po antibiotic because "it gives me diarrhea and heartburn". RN suggested she take it with heartburn and anti-diarrheal medication. Patient still refused. RN educated patient on the need to take the antibiotic for her illness and the risk of staying in the hospital longer if she doesn't. Patient still refused the antibiotic. On call MD notified.

## 2021-07-24 NOTE — Progress Notes (Signed)
Nsg Discharge Note  Admit Date:  07/19/2021 Discharge date: 07/24/2021   Reece Packer to be D/C'd Home per MD order.  AVS completed.  Copy for chart, and copy for patient signed, and dated. Patient/caregiver able to verbalize understanding.  Discharge Medication: Allergies as of 07/24/2021       Reactions   Amoxicillin Nausea And Vomiting   Can take penicillin but Amoxicillin makes her have upset stomach   Aspirin Nausea And Vomiting, Other (See Comments)   Heart palpations   Nsaids Nausea And Vomiting   Prednisone Other (See Comments)   Hyper  Mental changes   Iodinated Diagnostic Agents Swelling, Rash   Throat swelling        Medication List     STOP taking these medications    hydrochlorothiazide 12.5 MG tablet Commonly known as: HYDRODIURIL   losartan 100 MG tablet Commonly known as: COZAAR       TAKE these medications    albuterol 108 (90 Base) MCG/ACT inhaler Commonly known as: VENTOLIN HFA Inhale 2 puffs into the lungs every 6 (six) hours as needed for wheezing or shortness of breath.   baclofen 10 MG tablet Commonly known as: LIORESAL Take 10 mg by mouth 3 (three) times daily as needed.   budesonide-formoterol 160-4.5 MCG/ACT inhaler Commonly known as: SYMBICORT Inhale 2 puffs into the lungs 2 (two) times daily.   dexamethasone 6 MG tablet Commonly known as: DECADRON Take 1 tablet (6 mg total) by mouth daily for 4 days.   doxycycline 100 MG tablet Commonly known as: VIBRA-TABS Take 1 tablet (100 mg total) by mouth every 12 (twelve) hours for 3 days.   DULoxetine 60 MG capsule Commonly known as: CYMBALTA Take 60 mg by mouth 2 (two) times daily.   guaiFENesin-dextromethorphan 100-10 MG/5ML syrup Commonly known as: ROBITUSSIN DM Take 10 mLs by mouth every 8 (eight) hours.   loperamide 2 MG capsule Commonly known as: IMODIUM Take 2 mg by mouth as needed for diarrhea or loose stools.   losartan-hydrochlorothiazide 100-12.5 MG  tablet Commonly known as: HYZAAR Take 1 tablet by mouth daily.   metoprolol succinate 50 MG 24 hr tablet Commonly known as: TOPROL-XL Take 50 mg by mouth daily. What changed: Another medication with the same name was removed. Continue taking this medication, and follow the directions you see here.   morphine 30 MG 12 hr tablet Commonly known as: MS CONTIN Take 30 mg by mouth 2 (two) times daily.   oxyCODONE-acetaminophen 10-325 MG tablet Commonly known as: PERCOCET Take 1 tablet by mouth 3 (three) times daily.   pramipexole 0.25 MG tablet Commonly known as: MIRAPEX Take 0.25 mg by mouth at bedtime.   traZODone 50 MG tablet Commonly known as: DESYREL Take 50 mg by mouth at bedtime as needed.   venlafaxine XR 37.5 MG 24 hr capsule Commonly known as: EFFEXOR-XR Take 37.5 mg by mouth daily.               Durable Medical Equipment  (From admission, onward)           Start     Ordered   07/24/21 1026  DME Walker  Once       Question Answer Comment  Walker: With 5 Inch Wheels   Patient needs a walker to treat with the following condition Weakness      07/24/21 1026            Discharge Assessment: Vitals:   07/24/21 0559 07/24/21 0828  BP: Marland Kitchen)  163/84   Pulse: 73   Resp: 18   Temp: 98.4 F (36.9 C)   SpO2: 94% 91%   Skin clean, dry and intact without evidence of skin break down, no evidence of skin tears noted. IV catheter discontinued intact. Site without signs and symptoms of complications - no redness or edema noted at insertion site, patient denies c/o pain - only slight tenderness at site.  Dressing with slight pressure applied.  D/c Instructions-Education: Discharge instructions given to patient/family with verbalized understanding. D/c education completed with patient/family including follow up instructions, medication list, d/c activities limitations if indicated, with other d/c instructions as indicated by MD - patient able to verbalize  understanding, all questions fully answered. Patient instructed to return to ED, call 911, or call MD for any changes in condition.  Patient escorted via WC, and D/C home via private auto.  Brandy Hale, LPN [id number] 03:21 AM

## 2021-09-06 ENCOUNTER — Other Ambulatory Visit (HOSPITAL_COMMUNITY): Payer: Self-pay | Admitting: Nurse Practitioner

## 2021-09-06 DIAGNOSIS — Z780 Asymptomatic menopausal state: Secondary | ICD-10-CM

## 2021-12-07 ENCOUNTER — Other Ambulatory Visit: Payer: Self-pay | Admitting: Nurse Practitioner

## 2021-12-07 DIAGNOSIS — M542 Cervicalgia: Secondary | ICD-10-CM

## 2021-12-20 ENCOUNTER — Ambulatory Visit
Admission: RE | Admit: 2021-12-20 | Discharge: 2021-12-20 | Disposition: A | Discharge status: Home / Self Care | Payer: Medicare Other | Source: Ambulatory Visit | Attending: Nurse Practitioner | Admitting: Nurse Practitioner

## 2021-12-20 DIAGNOSIS — M542 Cervicalgia: Secondary | ICD-10-CM

## 2022-01-10 ENCOUNTER — Other Ambulatory Visit: Payer: Self-pay | Admitting: Neurosurgery

## 2022-02-07 NOTE — Progress Notes (Signed)
Surgical Instructions ? ? ? Your procedure is scheduled on Tuesday, May 16th. ? Report to [facility] Main Entrance "A" at 6:00 A.M., then check in with the Admitting office. ? Call this number if you have problems the morning of surgery: ? [phone] ? ? If you have any questions prior to your surgery date call [phone]: Open Monday-Friday 8am-4pm ? ? ? Remember: ? Do not eat or drink after midnight the night before your surgery ? ? ? Take these medicines the morning of surgery with A SIP OF WATER:  ? Metoprolol ? Symbicort inhaler ? ? If needed: ? Tylenol ? Albuterol inhaler - bring with you on day of surgery ? Imodium ? Oxycodone-Acetaminophen (Percocet) ? ? ?As of today, STOP taking any Aspirin (unless otherwise instructed by your surgeon) Aleve, Naproxen, Ibuprofen, Motrin, Advil, Goody's, BC's, all herbal medications, fish oil, and all vitamins. ? ?         DAY OF SURGERY: ?Do not wear jewelry or makeup ?Do not wear lotions, powders, perfumes, or deodorant. ?Do not shave 48 hours prior to surgery.   ?Do not bring valuables to the hospital. ?Do not wear nail polish, gel polish, artificial nails, or any other type of covering on natural nails (fingers and toes) ?If you have artificial nails or gel coating that need to be removed by a nail salon, please have this removed prior to surgery. Artificial nails or gel coating may interfere with anesthesia's ability to adequately monitor your vital signs. ? ?[facility] is not responsible for any belongings or valuables. .  ? ?Do NOT Smoke (Tobacco/Vaping)  24 hours prior to your procedure ? ?If you use a CPAP at night, you may bring your mask for your overnight stay. ?  ?Contacts, glasses, hearing aids, dentures or partials may not be worn into surgery, please bring cases for these belongings ?  ?For patients admitted to the hospital, discharge time will be determined by your treatment team. ?  ?Patients discharged the day of surgery will not be allowed to drive  home, and someone needs to stay with them for 24 hours. ? ? ?SURGICAL WAITING ROOM VISITATION ?Patients having surgery or a procedure in a hospital may have two support people. ?Children under the age of 62 must have an adult with them who is not the patient. ?They may stay in the waiting area during the procedure and may switch out with other visitors. If the patient needs to stay at the hospital during part of their recovery, the visitor guidelines for inpatient rooms apply. ? ?Please refer to the [facility] website for the visitor guidelines for Inpatients (after your surgery is over and you are in a regular room).  ? ? ?Special instructions:   ? ?Oral Hygiene is also important to reduce your risk of infection.  Remember - BRUSH YOUR TEETH THE MORNING OF SURGERY WITH YOUR REGULAR TOOTHPASTE ? ? ?[facility]- Preparing For Surgery ? ?Before surgery, you can play an important role. Because skin is not sterile, your skin needs to be as free of germs as possible. You can reduce the number of germs on your skin by washing with CHG (chlorahexidine gluconate) Soap before surgery.  CHG is an antiseptic cleaner which kills germs and bonds with the skin to continue killing germs even after washing.   ? ? ?Please do not use if you have an allergy to CHG or antibacterial soaps. If your skin becomes reddened/irritated stop using the CHG.  ?Do not shave (including  legs and underarms) for at least 48 hours prior to first CHG shower. It is OK to shave your face. ? ?Please follow these instructions carefully. ?  ? ? Shower the NIGHT BEFORE SURGERY and the MORNING OF SURGERY with CHG Soap.  ? If you chose to wash your hair, wash your hair first as usual with your normal shampoo. After you shampoo, rinse your hair and body thoroughly to remove the shampoo.  Then [employer] and genitals (private parts) with your normal soap and rinse thoroughly to remove soap. ? ?After that Use CHG Soap as you would any other liquid soap. You can  apply CHG directly to the skin and wash gently with a scrungie or a clean washcloth.  ? ?Apply the CHG Soap to your body ONLY FROM THE NECK DOWN.  Do not use on open wounds or open sores. Avoid contact with your eyes, ears, mouth and genitals (private parts). Wash Face and genitals (private parts)  with your normal soap.  ? ?Wash thoroughly, paying special attention to the area where your surgery will be performed. ? ?Thoroughly rinse your body with warm water from the neck down. ? ?DO NOT shower/wash with your normal soap after using and rinsing off the CHG Soap. ? ?Pat yourself dry with a CLEAN TOWEL. ? ?Wear CLEAN PAJAMAS to bed the night before surgery ? ?Place CLEAN SHEETS on your bed the night before your surgery ? ?DO NOT SLEEP WITH PETS. ? ? ?Day of Surgery: ? ?Take a shower with CHG soap. ?Wear Clean/Comfortable clothing the morning of surgery ?Do not apply any deodorants/lotions.   ?Remember to brush your teeth WITH YOUR REGULAR TOOTHPASTE. ? ? ?Please read over the following fact sheets that you were given.  ? ?

## 2022-02-08 ENCOUNTER — Encounter (HOSPITAL_COMMUNITY): Payer: Self-pay

## 2022-02-08 ENCOUNTER — Encounter (HOSPITAL_COMMUNITY)
Admission: RE | Admit: 2022-02-08 | Discharge: 2022-02-08 | Disposition: A | Discharge status: Home / Self Care | Payer: Medicare Other | Source: Ambulatory Visit | Attending: Neurosurgery | Admitting: Neurosurgery

## 2022-02-08 ENCOUNTER — Other Ambulatory Visit: Payer: Self-pay

## 2022-02-08 VITALS — BP 115/61 | HR 72 | Temp 97.7°F | Resp 18 | Ht 63.0 in | Wt 100.0 lb

## 2022-02-08 DIAGNOSIS — Z01818 Encounter for other preprocedural examination: Secondary | ICD-10-CM

## 2022-02-08 DIAGNOSIS — Z01812 Encounter for preprocedural laboratory examination: Secondary | ICD-10-CM | POA: Insufficient documentation

## 2022-02-08 DIAGNOSIS — I10 Essential (primary) hypertension: Secondary | ICD-10-CM | POA: Insufficient documentation

## 2022-02-08 HISTORY — DX: Pneumonia, unspecified organism: J18.9

## 2022-02-08 LAB — CBC
HCT: 47.7 % — ABNORMAL HIGH (ref 36.0–46.0)
Hemoglobin: 16.7 g/dL — ABNORMAL HIGH (ref 12.0–15.0)
MCH: 37 pg — ABNORMAL HIGH (ref 26.0–34.0)
MCHC: 35 g/dL (ref 30.0–36.0)
MCV: 105.8 fL — ABNORMAL HIGH (ref 80.0–100.0)
Platelets: 348 10*3/uL (ref 150–400)
RBC: 4.51 MIL/uL (ref 3.87–5.11)
RDW: 13 % (ref 11.5–15.5)
WBC: 10.5 10*3/uL (ref 4.0–10.5)
nRBC: 0 % (ref 0.0–0.2)

## 2022-02-08 LAB — BASIC METABOLIC PANEL
Anion gap: 6 (ref 5–15)
BUN: 5 mg/dL — ABNORMAL LOW (ref 8–23)
CO2: 33 mmol/L — ABNORMAL HIGH (ref 22–32)
Calcium: 9.6 mg/dL (ref 8.9–10.3)
Chloride: 97 mmol/L — ABNORMAL LOW (ref 98–111)
Creatinine, Ser: 0.52 mg/dL (ref 0.44–1.00)
GFR, Estimated: >60 mL/min (ref >60)
Glucose, Bld: 105 mg/dL — ABNORMAL HIGH (ref 70–99)
Potassium: 4.5 mmol/L (ref 3.5–5.1)
Sodium: 136 mmol/L (ref 135–145)

## 2022-02-08 LAB — SURGICAL PCR SCREEN
MRSA, PCR: NEGATIVE
Staphylococcus aureus: NEGATIVE

## 2022-02-08 NOTE — Progress Notes (Signed)
PCP - Mitzi Hansen, NP ?Cardiologist - Jodelle Red ? ?PPM/ICD - denies ? ? ?Chest x-ray - n/a ?EKG - 07/20/21 ?Stress Test - denies ?ECHO - 2020 ?Cardiac Cath - denies ? ?Sleep Study - denies ? ? ?NO diabetes ? ?As of today, STOP taking any Aspirin (unless otherwise instructed by your surgeon) Aleve, Naproxen, Ibuprofen, Motrin, Advil, Goody's, BC's, all herbal medications, fish oil, and all vitamins. ? ?ERAS Protcol -no ? ? ?COVID TEST- not needed ? ? ?Anesthesia review: no ? ?Patient denies shortness of breath, fever, cough and chest pain at PAT appointment ? ? ?All instructions explained to the patient, with a verbal understanding of the material. Patient agrees to go over the instructions while at home for a better understanding. Patient also instructed to self quarantine after being tested for COVID-19. The opportunity to ask questions was provided. ? ? ?

## 2022-02-14 ENCOUNTER — Encounter (HOSPITAL_COMMUNITY)
Admission: RE | Disposition: A | Discharge status: Home / Self Care | Payer: Self-pay | Source: Home / Self Care | Attending: Neurosurgery

## 2022-02-14 ENCOUNTER — Ambulatory Visit (HOSPITAL_BASED_OUTPATIENT_CLINIC_OR_DEPARTMENT_OTHER): Payer: Medicare Other | Admitting: Anesthesiology

## 2022-02-14 ENCOUNTER — Encounter (HOSPITAL_COMMUNITY): Payer: Self-pay | Admitting: Neurosurgery

## 2022-02-14 ENCOUNTER — Ambulatory Visit (HOSPITAL_COMMUNITY): Payer: Medicare Other | Admitting: Anesthesiology

## 2022-02-14 ENCOUNTER — Observation Stay (HOSPITAL_COMMUNITY)
Admission: RE | Admit: 2022-02-14 | Discharge: 2022-02-14 | Disposition: A | Discharge status: Home / Self Care | Payer: Medicare Other | Attending: Neurosurgery | Admitting: Neurosurgery

## 2022-02-14 ENCOUNTER — Other Ambulatory Visit: Payer: Self-pay

## 2022-02-14 ENCOUNTER — Ambulatory Visit (HOSPITAL_COMMUNITY): Payer: Medicare Other

## 2022-02-14 DIAGNOSIS — J449 Chronic obstructive pulmonary disease, unspecified: Secondary | ICD-10-CM | POA: Diagnosis not present

## 2022-02-14 DIAGNOSIS — M4802 Spinal stenosis, cervical region: Secondary | ICD-10-CM | POA: Diagnosis not present

## 2022-02-14 DIAGNOSIS — G959 Disease of spinal cord, unspecified: Secondary | ICD-10-CM | POA: Diagnosis not present

## 2022-02-14 DIAGNOSIS — I10 Essential (primary) hypertension: Secondary | ICD-10-CM | POA: Diagnosis not present

## 2022-02-14 DIAGNOSIS — Z8616 Personal history of COVID-19: Secondary | ICD-10-CM | POA: Insufficient documentation

## 2022-02-14 DIAGNOSIS — M542 Cervicalgia: Secondary | ICD-10-CM | POA: Diagnosis present

## 2022-02-14 DIAGNOSIS — J45909 Unspecified asthma, uncomplicated: Secondary | ICD-10-CM | POA: Diagnosis not present

## 2022-02-14 DIAGNOSIS — M4312 Spondylolisthesis, cervical region: Secondary | ICD-10-CM | POA: Diagnosis not present

## 2022-02-14 DIAGNOSIS — Z01818 Encounter for other preprocedural examination: Secondary | ICD-10-CM

## 2022-02-14 DIAGNOSIS — F17210 Nicotine dependence, cigarettes, uncomplicated: Secondary | ICD-10-CM | POA: Insufficient documentation

## 2022-02-14 DIAGNOSIS — Z79899 Other long term (current) drug therapy: Secondary | ICD-10-CM | POA: Diagnosis not present

## 2022-02-14 HISTORY — PX: ANTERIOR CERVICAL DECOMP/DISCECTOMY FUSION: SHX1161

## 2022-02-14 SURGERY — ANTERIOR CERVICAL DECOMPRESSION/DISCECTOMY FUSION 1 LEVEL
Anesthesia: General | Site: Spine Cervical

## 2022-02-14 MED ORDER — FENTANYL CITRATE (PF) 100 MCG/2ML IJ SOLN
25.0000 ug | INTRAMUSCULAR | Status: DC | PRN
  Administered 2022-02-14: 50 ug via INTRAVENOUS

## 2022-02-14 MED ORDER — METHOCARBAMOL 1000 MG/10ML IJ SOLN: 500.0000 mg | Freq: Four times a day (QID) | INTRAVENOUS | Status: DC | PRN

## 2022-02-14 MED ORDER — ONDANSETRON HCL 4 MG/2ML IJ SOLN
INTRAMUSCULAR | Status: AC
  Filled 2022-02-14: qty 2

## 2022-02-14 MED ORDER — ALBUTEROL SULFATE HFA 108 (90 BASE) MCG/ACT IN AERS: 2.0000 | INHALATION_SPRAY | Freq: Four times a day (QID) | RESPIRATORY_TRACT | Status: DC | PRN

## 2022-02-14 MED ORDER — THROMBIN 5000 UNITS EX SOLR
CUTANEOUS | Status: DC | PRN
  Administered 2022-02-14: 5000 [IU] via TOPICAL

## 2022-02-14 MED ORDER — MOMETASONE FURO-FORMOTEROL FUM 200-5 MCG/ACT IN AERO
2.0000 | INHALATION_SPRAY | Freq: Two times a day (BID) | RESPIRATORY_TRACT | Status: DC
  Filled 2022-02-14: qty 8.8

## 2022-02-14 MED ORDER — LACTATED RINGERS IV SOLN: INTRAVENOUS | Status: DC

## 2022-02-14 MED ORDER — ROCURONIUM BROMIDE 10 MG/ML (PF) SYRINGE
PREFILLED_SYRINGE | INTRAVENOUS | Status: DC | PRN
  Administered 2022-02-14: 10 mg via INTRAVENOUS
  Administered 2022-02-14: 40 mg via INTRAVENOUS

## 2022-02-14 MED ORDER — HYDROMORPHONE HCL 1 MG/ML IJ SOLN: 1.0000 mg | INTRAMUSCULAR | Status: DC | PRN

## 2022-02-14 MED ORDER — HYDROCODONE-ACETAMINOPHEN 5-325 MG PO TABS: 1.0000 | ORAL_TABLET | ORAL | Status: DC | PRN

## 2022-02-14 MED ORDER — ONDANSETRON HCL 4 MG/2ML IJ SOLN
INTRAMUSCULAR | Status: DC | PRN
  Administered 2022-02-14: 4 mg via INTRAVENOUS

## 2022-02-14 MED ORDER — DEXAMETHASONE SODIUM PHOSPHATE 10 MG/ML IJ SOLN
INTRAMUSCULAR | Status: AC
  Filled 2022-02-14: qty 1

## 2022-02-14 MED ORDER — PRAMIPEXOLE DIHYDROCHLORIDE 0.25 MG PO TABS: 0.2500 mg | ORAL_TABLET | Freq: Every day | ORAL | Status: DC

## 2022-02-14 MED ORDER — HEMOSTATIC AGENTS (NO CHARGE) OPTIME
TOPICAL | Status: DC | PRN
  Administered 2022-02-14: 1

## 2022-02-14 MED ORDER — SODIUM CHLORIDE 0.9% FLUSH: 3.0000 mL | INTRAVENOUS | Status: DC | PRN

## 2022-02-14 MED ORDER — HYDROCHLOROTHIAZIDE 12.5 MG PO TABS
12.5000 mg | ORAL_TABLET | Freq: Every morning | ORAL | Status: DC
  Administered 2022-02-14: 12.5 mg via ORAL
  Filled 2022-02-14: qty 1

## 2022-02-14 MED ORDER — 0.9 % SODIUM CHLORIDE (POUR BTL) OPTIME
TOPICAL | Status: DC | PRN
  Administered 2022-02-14: 1000 mL

## 2022-02-14 MED ORDER — ONDANSETRON HCL 4 MG PO TABS: 4.0000 mg | ORAL_TABLET | Freq: Four times a day (QID) | ORAL | Status: DC | PRN

## 2022-02-14 MED ORDER — CHLORHEXIDINE GLUCONATE CLOTH 2 % EX PADS
6.0000 | MEDICATED_PAD | Freq: Once | CUTANEOUS | Status: DC
Start: 2022-02-14 — End: 2022-02-14

## 2022-02-14 MED ORDER — ACETAMINOPHEN 500 MG PO TABS
1000.0000 mg | ORAL_TABLET | Freq: Once | ORAL | Status: AC
  Administered 2022-02-14: 500 mg via ORAL
  Filled 2022-02-14: qty 2

## 2022-02-14 MED ORDER — FENTANYL CITRATE (PF) 250 MCG/5ML IJ SOLN
INTRAMUSCULAR | Status: AC
  Filled 2022-02-14: qty 5

## 2022-02-14 MED ORDER — OXYCODONE-ACETAMINOPHEN 10-325 MG PO TABS: 1.0000 | ORAL_TABLET | Freq: Every day | ORAL | Status: DC

## 2022-02-14 MED ORDER — METHOCARBAMOL 500 MG PO TABS
500.0000 mg | ORAL_TABLET | Freq: Four times a day (QID) | ORAL | Status: DC | PRN
  Administered 2022-02-14: 500 mg via ORAL
  Filled 2022-02-14: qty 1

## 2022-02-14 MED ORDER — HYDROCODONE-ACETAMINOPHEN 10-325 MG PO TABS: 2.0000 | ORAL_TABLET | ORAL | Status: DC | PRN

## 2022-02-14 MED ORDER — LIDOCAINE 2% (20 MG/ML) 5 ML SYRINGE
INTRAMUSCULAR | Status: DC | PRN
  Administered 2022-02-14: 50 mg via INTRAVENOUS

## 2022-02-14 MED ORDER — FENTANYL CITRATE (PF) 100 MCG/2ML IJ SOLN
INTRAMUSCULAR | Status: AC
  Filled 2022-02-14: qty 2

## 2022-02-14 MED ORDER — ACETAMINOPHEN 650 MG RE SUPP: 650.0000 mg | RECTAL | Status: DC | PRN

## 2022-02-14 MED ORDER — LOSARTAN POTASSIUM 50 MG PO TABS
50.0000 mg | ORAL_TABLET | Freq: Every morning | ORAL | Status: DC
  Administered 2022-02-14: 50 mg via ORAL
  Filled 2022-02-14: qty 1

## 2022-02-14 MED ORDER — METOPROLOL SUCCINATE ER 100 MG PO TB24: 100.0000 mg | ORAL_TABLET | Freq: Every morning | ORAL | Status: DC

## 2022-02-14 MED ORDER — MENTHOL 3 MG MT LOZG
1.0000 | LOZENGE | OROMUCOSAL | Status: DC | PRN
  Administered 2022-02-14: 3 mg via ORAL
  Filled 2022-02-14: qty 9

## 2022-02-14 MED ORDER — PROPOFOL 10 MG/ML IV BOLUS
INTRAVENOUS | Status: DC | PRN
  Administered 2022-02-14: 100 mg via INTRAVENOUS

## 2022-02-14 MED ORDER — DEXAMETHASONE SODIUM PHOSPHATE 4 MG/ML IJ SOLN
INTRAMUSCULAR | Status: DC | PRN
  Administered 2022-02-14: 8 mg via INTRAVENOUS

## 2022-02-14 MED ORDER — OXYCODONE-ACETAMINOPHEN 5-325 MG PO TABS
1.0000 | ORAL_TABLET | Freq: Every day | ORAL | Status: DC
Start: 2022-02-14 — End: 2022-02-14
  Administered 2022-02-14 (×2): 1 via ORAL
  Filled 2022-02-14 (×2): qty 1

## 2022-02-14 MED ORDER — ALBUTEROL SULFATE (2.5 MG/3ML) 0.083% IN NEBU
2.5000 mg | INHALATION_SOLUTION | Freq: Four times a day (QID) | RESPIRATORY_TRACT | Status: DC | PRN
Start: 2022-02-14 — End: 2022-02-14

## 2022-02-14 MED ORDER — TRAZODONE HCL 50 MG PO TABS: 100.0000 mg | ORAL_TABLET | Freq: Every day | ORAL | Status: DC

## 2022-02-14 MED ORDER — CHLORHEXIDINE GLUCONATE 0.12 % MT SOLN
15.0000 mL | Freq: Once | OROMUCOSAL | Status: AC
  Administered 2022-02-14: 15 mL via OROMUCOSAL
  Filled 2022-02-14: qty 15

## 2022-02-14 MED ORDER — SUGAMMADEX SODIUM 200 MG/2ML IV SOLN
INTRAVENOUS | Status: DC | PRN
  Administered 2022-02-14: 200 mg via INTRAVENOUS

## 2022-02-14 MED ORDER — FENTANYL CITRATE (PF) 100 MCG/2ML IJ SOLN
INTRAMUSCULAR | Status: DC | PRN
  Administered 2022-02-14 (×2): 100 ug via INTRAVENOUS
  Administered 2022-02-14: 50 ug via INTRAVENOUS

## 2022-02-14 MED ORDER — SODIUM CHLORIDE 0.9% FLUSH
3.0000 mL | Freq: Two times a day (BID) | INTRAVENOUS | Status: DC
  Administered 2022-02-14: 3 mL via INTRAVENOUS

## 2022-02-14 MED ORDER — LOPERAMIDE HCL 2 MG PO CAPS: 2.0000 mg | ORAL_CAPSULE | Freq: Four times a day (QID) | ORAL | Status: DC | PRN

## 2022-02-14 MED ORDER — ACETAMINOPHEN 325 MG PO TABS: 650.0000 mg | ORAL_TABLET | ORAL | Status: DC | PRN

## 2022-02-14 MED ORDER — THROMBIN 5000 UNITS EX SOLR
CUTANEOUS | Status: AC
  Filled 2022-02-14: qty 10000

## 2022-02-14 MED ORDER — CEFAZOLIN SODIUM-DEXTROSE 1-4 GM/50ML-% IV SOLN: 1.0000 g | Freq: Once | INTRAVENOUS | Status: DC

## 2022-02-14 MED ORDER — ONDANSETRON HCL 4 MG/2ML IJ SOLN: 4.0000 mg | Freq: Four times a day (QID) | INTRAMUSCULAR | Status: DC | PRN

## 2022-02-14 MED ORDER — CHLORHEXIDINE GLUCONATE CLOTH 2 % EX PADS: 6.0000 | MEDICATED_PAD | Freq: Once | CUTANEOUS | Status: DC

## 2022-02-14 MED ORDER — VANCOMYCIN HCL IN DEXTROSE 1-5 GM/200ML-% IV SOLN
1000.0000 mg | INTRAVENOUS | Status: AC
  Administered 2022-02-14: 1000 mg via INTRAVENOUS
  Filled 2022-02-14: qty 200

## 2022-02-14 MED ORDER — ONDANSETRON HCL 4 MG/2ML IJ SOLN: 4.0000 mg | Freq: Once | INTRAMUSCULAR | Status: DC | PRN

## 2022-02-14 MED ORDER — LIDOCAINE 2% (20 MG/ML) 5 ML SYRINGE
INTRAMUSCULAR | Status: AC
  Filled 2022-02-14: qty 5

## 2022-02-14 MED ORDER — SODIUM CHLORIDE 0.9 % IV SOLN
250.0000 mL | INTRAVENOUS | Status: DC
  Administered 2022-02-14: 250 mL via INTRAVENOUS

## 2022-02-14 MED ORDER — ORAL CARE MOUTH RINSE: 15.0000 mL | Freq: Once | OROMUCOSAL | Status: AC

## 2022-02-14 MED ORDER — MIDAZOLAM HCL 2 MG/2ML IJ SOLN
INTRAMUSCULAR | Status: AC
  Filled 2022-02-14: qty 2

## 2022-02-14 MED ORDER — PHENOL 1.4 % MT LIQD: 1.0000 | OROMUCOSAL | Status: DC | PRN

## 2022-02-14 MED ORDER — MIDAZOLAM HCL 5 MG/5ML IJ SOLN
INTRAMUSCULAR | Status: DC | PRN
  Administered 2022-02-14: 2 mg via INTRAVENOUS

## 2022-02-14 MED ORDER — OXYCODONE HCL 5 MG PO TABS
5.0000 mg | ORAL_TABLET | Freq: Every day | ORAL | Status: DC
  Administered 2022-02-14 (×2): 5 mg via ORAL
  Filled 2022-02-14 (×2): qty 1

## 2022-02-14 MED ORDER — ROCURONIUM BROMIDE 10 MG/ML (PF) SYRINGE
PREFILLED_SYRINGE | INTRAVENOUS | Status: AC
  Filled 2022-02-14: qty 10

## 2022-02-14 SURGICAL SUPPLY — 58 items
ADH SKN CLS APL DERMABOND .7 (GAUZE/BANDAGES/DRESSINGS) ×1
APL SKNCLS STERI-STRIP NONHPOA (GAUZE/BANDAGES/DRESSINGS) ×1
BAG COUNTER SPONGE SURGICOUNT (BAG) ×2 IMPLANT
BAG DECANTER FOR FLEXI CONT (MISCELLANEOUS) ×2 IMPLANT
BAND RUBBER #18 3X1/16 STRL (MISCELLANEOUS) ×4 IMPLANT
BENZOIN TINCTURE PRP APPL 2/3 (GAUZE/BANDAGES/DRESSINGS) ×2 IMPLANT
BUR MATCHSTICK NEURO 3.0 LAGG (BURR) ×2 IMPLANT
CAGE PEEK 6X14X11 (Cage) ×2 IMPLANT
CANISTER SUCT 3000ML PPV (MISCELLANEOUS) ×2 IMPLANT
CARTRIDGE OIL MAESTRO DRILL (MISCELLANEOUS) ×1 IMPLANT
DERMABOND ADVANCED (GAUZE/BANDAGES/DRESSINGS) ×1
DERMABOND ADVANCED .7 DNX12 (GAUZE/BANDAGES/DRESSINGS) IMPLANT
DIFFUSER DRILL AIR PNEUMATIC (MISCELLANEOUS) ×2 IMPLANT
DRAPE C-ARM 42X72 X-RAY (DRAPES) ×4 IMPLANT
DRAPE LAPAROTOMY 100X72 PEDS (DRAPES) ×2 IMPLANT
DRAPE MICROSCOPE LEICA (MISCELLANEOUS) ×2 IMPLANT
DRSG OPSITE POSTOP 3X4 (GAUZE/BANDAGES/DRESSINGS) ×1 IMPLANT
DURAPREP 6ML APPLICATOR 50/CS (WOUND CARE) ×2 IMPLANT
ELECT COATED BLADE 2.86 ST (ELECTRODE) ×2 IMPLANT
ELECT REM PT RETURN 9FT ADLT (ELECTROSURGICAL) ×2
ELECTRODE REM PT RTRN 9FT ADLT (ELECTROSURGICAL) ×1 IMPLANT
GAUZE 4X4 16PLY ~~LOC~~+RFID DBL (SPONGE) IMPLANT
GAUZE SPONGE 4X4 12PLY STRL (GAUZE/BANDAGES/DRESSINGS) ×2 IMPLANT
GLOVE ECLIPSE 9.0 STRL (GLOVE) ×2 IMPLANT
GLOVE EXAM NITRILE XL STR (GLOVE) IMPLANT
GOWN STRL REUS W/ TWL LRG LVL3 (GOWN DISPOSABLE) IMPLANT
GOWN STRL REUS W/ TWL XL LVL3 (GOWN DISPOSABLE) IMPLANT
GOWN STRL REUS W/TWL 2XL LVL3 (GOWN DISPOSABLE) IMPLANT
GOWN STRL REUS W/TWL LRG LVL3 (GOWN DISPOSABLE)
GOWN STRL REUS W/TWL XL LVL3 (GOWN DISPOSABLE)
HALTER HD/CHIN CERV TRACTION D (MISCELLANEOUS) ×2 IMPLANT
HEMOSTAT POWDER KIT SURGIFOAM (HEMOSTASIS) IMPLANT
KIT BASIN OR (CUSTOM PROCEDURE TRAY) ×2 IMPLANT
KIT TURNOVER KIT B (KITS) ×2 IMPLANT
NDL SPNL 20GX3.5 QUINCKE YW (NEEDLE) ×1 IMPLANT
NEEDLE SPNL 20GX3.5 QUINCKE YW (NEEDLE) ×2 IMPLANT
NS IRRIG 1000ML POUR BTL (IV SOLUTION) ×2 IMPLANT
OIL CARTRIDGE MAESTRO DRILL (MISCELLANEOUS) ×2
PACK LAMINECTOMY NEURO (CUSTOM PROCEDURE TRAY) ×2 IMPLANT
PAD ARMBOARD 7.5X6 YLW CONV (MISCELLANEOUS) ×6 IMPLANT
PLATE 23MM (Plate) ×1 IMPLANT
PUTTY DBX 1CC (Putty) ×2 IMPLANT
PUTTY DBX 1CC DEPUY (Putty) IMPLANT
SCREW ST 14X4XST FXANG SPNE (Screw) IMPLANT
SCREW ST 15X4XST FXANG NS (Screw) IMPLANT
SCREW ST FIX 4 ATL (Screw) ×8 IMPLANT
SPACER SPNL 11X14X6XPEEK CVD (Cage) IMPLANT
SPCR SPNL 11X14X6XPEEK CVD (Cage) ×1 IMPLANT
SPONGE INTESTINAL PEANUT (DISPOSABLE) ×2 IMPLANT
SPONGE SURGIFOAM ABS GEL SZ50 (HEMOSTASIS) ×2 IMPLANT
STRIP CLOSURE SKIN 1/2X4 (GAUZE/BANDAGES/DRESSINGS) ×2 IMPLANT
SUT VIC AB 3-0 SH 8-18 (SUTURE) ×2 IMPLANT
SUT VIC AB 4-0 RB1 18 (SUTURE) ×2 IMPLANT
TAPE CLOTH 4X10 WHT NS (GAUZE/BANDAGES/DRESSINGS) ×2 IMPLANT
TOWEL GREEN STERILE (TOWEL DISPOSABLE) ×2 IMPLANT
TOWEL GREEN STERILE FF (TOWEL DISPOSABLE) ×2 IMPLANT
TRAP SPECIMEN MUCUS 40CC (MISCELLANEOUS) ×2 IMPLANT
WATER STERILE IRR 1000ML POUR (IV SOLUTION) ×2 IMPLANT

## 2022-02-14 NOTE — Brief Op Note (Signed)
02/14/2022 ? ?9:15 AM ? ?PATIENT:  Renee Oliver  66 y.o. female ? ?PRE-OPERATIVE DIAGNOSIS:  Spondylolisthesis ? ?POST-OPERATIVE DIAGNOSIS:  * No post-op diagnosis entered * ? ?PROCEDURE:  Procedure(s): ?Anterior Cervical Decompression Fusion  - C3-C4 (N/A) ? ?SURGEON:  Surgeon(s) and Role: ?   Julio Sicks, MD - Primary ? ?PHYSICIAN ASSISTANT:  ? ?ASSISTANTS: Bergman,NP  ? ?ANESTHESIA:   general ? ?EBL:  25 mL  ? ?BLOOD ADMINISTERED:none ? ?DRAINS: none  ? ?LOCAL MEDICATIONS USED:  NONE ? ?SPECIMEN:  No Specimen ? ?DISPOSITION OF SPECIMEN:  N/A ? ?COUNTS:  YES ? ?TOURNIQUET:  * No tourniquets in log * ? ?DICTATION: .Dragon Dictation ? ?PLAN OF CARE: Admit for overnight observation ? ?PATIENT DISPOSITION:  PACU - hemodynamically stable. ?  ?Delay start of Pharmacological VTE agent (>24hrs) due to surgical blood loss or risk of bleeding: yes ? ?

## 2022-02-14 NOTE — Plan of Care (Signed)

## 2022-02-14 NOTE — Evaluation (Signed)
Occupational Therapy Evaluation ?Patient Details ?Name: Renee Oliver ?MRN: [id number] ?DOB: 1956/04/14 ?Today's Date: 02/14/2022 ? ? ?History of Present Illness 66 yo female s/p ACDF c3-4 PMH anxiety COPD DDD depression HTN back surg  ? ?Clinical Impression ?  ?Patient evaluated by Occupational Therapy with no further acute OT needs identified. All education has been completed and the patient has no further questions. See below for any follow-up Occupational Therapy or equipment needs. OT to sign off. Thank you for referral.  ?  ?   ? ?Recommendations for follow up therapy are one component of a multi-disciplinary discharge planning process, led by the attending physician.  Recommendations may be updated based on patient status, additional functional criteria and insurance authorization.  ? ?Follow Up Recommendations ? No OT follow up  ?  ?Assistance Recommended at Discharge PRN  ?Patient can return home with the following Assist for transportation ? ?  ?Functional Status Assessment ? Patient has had a recent decline in their functional status and demonstrates the ability to make significant improvements in function in a reasonable and predictable amount of time.  ?Equipment Recommendations ? None recommended by OT  ?  ?Recommendations for Other Services   ? ? ?  ?Precautions / Restrictions Precautions ?Precautions: Back ?Precaution Comments: handout for neck provided ?Required Braces or Orthoses: Cervical Brace ?Cervical Brace: Soft collar;At all times  ? ?  ? ?Mobility Bed Mobility ?Overal bed mobility: Independent ?  ?  ?  ?  ?  ?  ?  ?  ? ?Transfers ?Overall transfer level: Modified independent ?  ?  ?  ?  ?  ?  ?  ?  ?  ?  ? ?  ?Balance Overall balance assessment: Modified Independent ?  ?  ?  ?  ?  ?  ?  ?  ?  ?  ?  ?  ?  ?  ?  ?  ?  ?  ?   ? ?ADL either performed or assessed with clinical judgement  ? ?ADL Overall ADL's : Modified independent ?  ?  ?  ?  ?  ?  ?  ?  ?  ?  ?  ?  ?  ?  ?  ?  ?  ?  ?  ?General  ADL Comments: able to figure 4 cross, complete stair transfers, bed transfer, grooming task  ?Cervical precautions ( handout provided): Educated patient on don doff brace with return demonstration, educated on oral care using cups, washing face with cloth, never to wash directly on incision site, avoid neck rotation flexion and extension, positioning with pillows in chair for bil UE, sleeping positioning, avoiding pushing / pulling with bil UE,   Pt educated on need to notify doctor / RN of swallowing changes or choking..  ? ? ? ?Vision Baseline Vision/History: 1 Wears glasses ?Ability to See in Adequate Light: 0 Adequate ?   ?   ?Perception   ?  ?Praxis   ?  ? ?Pertinent Vitals/Pain Pain Assessment ?Pain Assessment: 0-10 ?Pain Score: 2  ?Pain Location: neck, HA at the end of session ?Pain Descriptors / Indicators: Headache, Discomfort ?Pain Intervention(s): Repositioned, Premedicated before session, Monitored during session  ? ? ? ?Hand Dominance Left ?  ?Extremity/Trunk Assessment Upper Extremity Assessment ?Upper Extremity Assessment: Overall WFL for tasks assessed;RUE deficits/detail;LUE deficits/detail ?RUE Deficits / Details: reports some numbness at figure tips ?LUE Deficits / Details: reports same at pre surgery with numbness at digits no worsening or improvement ?  ?  Lower Extremity Assessment ?Lower Extremity Assessment: Overall WFL for tasks assessed ?  ?Cervical / Trunk Assessment ?Cervical / Trunk Assessment: Neck Surgery ?  ?Communication Communication ?Communication: No difficulties ?  ?Cognition Arousal/Alertness: Awake/alert ?Behavior During Therapy: [facility] for tasks assessed/performed ?Overall Cognitive Status: Within Functional Limits for tasks assessed ?  ?  ?  ?  ?  ?  ?  ?  ?  ?  ?  ?  ?  ?  ?  ?  ?  ?  ?  ?General Comments  dressing dry and intact, educated on don doff soft collar ? ?  ?Exercises   ?  ?Shoulder Instructions    ? ? ?Home Living Family/patient expects to be discharged to:: Private  residence ?Living Arrangements: Spouse/significant other ?Available Help at Discharge: Family;Available 24 hours/day ?Type of Home: Mobile home ?Home Access: Stairs to enter ?Entrance Stairs-Number of Steps: 8 ?Entrance Stairs-Rails: Right;Left;Can reach both ?Home Layout: One level ?  ?  ?Bathroom Shower/Tub: Tub/shower unit ?  ?Bathroom Toilet: Standard ?Bathroom Accessibility: Yes ?  ?Home Equipment: Agricultural consultant (2 wheels) (recliner) ?  ?Additional Comments: verified by patient ?  ? ?  ?Prior Functioning/Environment Prior Level of Function : Independent/Modified Independent;Driving ?  ?  ?  ?  ?  ?  ?  ?  ?  ? ?  ?  ?OT Problem List:   ?  ?   ?OT Treatment/Interventions:    ?  ?OT Goals(Current goals can be found in the care plan section) Acute Rehab OT Goals ?Patient Stated Goal: to go home today ?OT Goal Formulation: With patient ?Potential to Achieve Goals: Good  ?OT Frequency:   ?  ? ?Co-evaluation   ?  ?  ?  ?  ? ?  ?AM-PAC OT "6 Clicks" Daily Activity     ?Outcome Measure Help from another person eating meals?: None ?Help from another person taking care of personal grooming?: None ?Help from another person toileting, which includes using toliet, bedpan, or urinal?: None ?Help from another person bathing (including washing, rinsing, drying)?: None ?Help from another person to put on and taking off regular upper body clothing?: None ?Help from another person to put on and taking off regular lower body clothing?: None ?6 Click Score: 24 ?  ?End of Session Equipment Utilized During Treatment: Cervical collar ?Nurse Communication: Mobility status;Precautions ? ?Activity Tolerance: Patient tolerated treatment well ?Patient left: in bed;with call bell/phone within reach ? ?OT Visit Diagnosis: Unsteadiness on feet (R26.81)  ?              ?Time: 1411-1431 ?OT Time Calculation (min): 20 min ?Charges:  OT General Charges ?$OT Visit: 1 Visit ?OT Evaluation ?$OT Eval Moderate Complexity: 1 Mod ? ? ?Brynn, OTR/L   ?Acute Rehabilitation Services ?Office: [phone] ?. ? ? ?Mateo Flow ?02/14/2022, 3:24 PM ?

## 2022-02-14 NOTE — Progress Notes (Signed)
Orthopedic Tech Progress Note ?Patient Details:  ?Renee Oliver ?04-01-1956 ?[id number] ? ?RN stated patient has on soft collar  ? ?Patient ID: Renee Oliver, female   DOB: [birth year], 66 y.o.   MRN: [id number] ? ?[name] ?02/14/2022, 1:04 PM ? ?

## 2022-02-14 NOTE — Discharge Summary (Signed)
Physician Discharge Summary     Providing Compassionate, Quality Care - Together   Patient ID: Renee Oliver MRN: [id number] DOB/AGE: Nov 20, 1955 66 y.o.  Admit date: 02/14/2022 Discharge date: 02/14/2022  Admission Diagnoses:   Cervical stenosis of spinal canal  Discharge Diagnoses:  Principal Problem:   Cervical stenosis of spinal canal   Discharged Condition: good  Hospital Course: Patient underwent a C3-4 ACDF by Dr. Annette Stable on 02/14/2022. She was admitted to 3C03 following recovery from anesthesia in the PACU. Her postoperative course has been uncomplicated. She has worked with occupational therapy who feel the patient is ready for discharge home. She is ambulating independently and without difficulty. She is tolerating a normal diet. She is not having any bowel or bladder dysfunction. Her pain is well-controlled with oral pain medication. She is ready for discharge home.   Consults: None  Significant Diagnostic Studies: radiology: DG Cervical Spine 1 View  Result Date: 02/14/2022 CLINICAL DATA:  C3-C4 ACDF EXAM: DG CERVICAL SPINE - 1 VIEW COMPARISON:  Cervical spine MRI 12/20/2021 FINDINGS: Two C-arm fluoroscopic images were obtained intraoperatively and submitted for post operative interpretation. The initial image demonstrates surgical hardware within the C3-C4 disc space. The subsequent image demonstrates postsurgical changes reflecting C3-C4 ACDF. There is no evidence of immediate complication. There is grade 1 anterolisthesis of C3 on C4, similar to the prior MRI. An endotracheal tube is noted. Fluoro time 7 seconds, dose 0.582 mGy. Please see the performing provider's procedural report for further detail. IMPRESSION: Intraoperative images during C3-C4 ACDF as above without evidence of immediate complication. Electronically Signed   By: Valetta Mole M.D.   On: 02/14/2022 09:34   DG C-Arm 1-60 Min-No Report  Result Date: 02/14/2022 Fluoroscopy was utilized by the requesting  physician.  No radiographic interpretation.     Treatments: surgery: C3-4 anterior cervical discectomy with interbody fusion utilizing interbody cage, local harvested autograft, morselized allograft, and anterior plate instrumentation  Discharge Exam: Blood pressure (!) 151/70, pulse 71, temperature 98.2 F (36.8 C), resp. rate 16, height 5\' 3"  (1.6 m), weight 45.4 kg, SpO2 96 %.  Per report: Alert and oriented x 4 PERRLA CN II-XII grossly intact MAE, Strength and sensation intact Incision is covered with Honeycomb dressing and Steri Strips; Dressing is clean, dry, and intact   Disposition: Discharge disposition: 01-Home or Self Care        Allergies as of 02/14/2022       Reactions   Amoxicillin Nausea And Vomiting   Can take penicillin but Amoxicillin makes her have upset stomach   Aspirin Nausea And Vomiting, Other (See Comments)   Heart palpations   Celexa [citalopram] Nausea Only, Other (See Comments)   heartburn   Doxycycline Diarrhea, Nausea Only   Flexeril [cyclobenzaprine] Other (See Comments)   "Makes me feel very strange"/upset stomach   Lyrica [pregabalin] Nausea Only, Other (See Comments)   "Makes me feel strange/weird"   Magnesium Diarrhea, Nausea Only   Neurontin [gabapentin] Nausea Only, Other (See Comments)   "Makes me feel very strange/weird"   Nsaids Nausea And Vomiting   Prednisone Other (See Comments)   Hyper  Mental changes   Iodinated Contrast Media Swelling, Rash   Throat swelling   Tramadol Nausea Only, Anxiety   Insomnia/anorexia/mood changes/hyperactive (injection version per patient)        Medication List     TAKE these medications    acetaminophen 325 MG tablet Commonly known as: TYLENOL Take 325-650 mg by mouth every 6 (six) hours as  needed (for pain.).   albuterol 108 (90 Base) MCG/ACT inhaler Commonly known as: VENTOLIN HFA Inhale 2 puffs into the lungs every 6 (six) hours as needed for wheezing or shortness of breath.    hydrochlorothiazide 12.5 MG tablet Commonly known as: HYDRODIURIL Take 12.5 mg by mouth in the morning.   loperamide 2 MG capsule Commonly known as: IMODIUM Take 2 mg by mouth 4 (four) times daily as needed for diarrhea or loose stools.   losartan 50 MG tablet Commonly known as: COZAAR Take 50 mg by mouth in the morning.   metoprolol succinate 100 MG 24 hr tablet Commonly known as: TOPROL-XL Take 100 mg by mouth in the morning.   oxyCODONE-acetaminophen 10-325 MG tablet Commonly known as: PERCOCET Take 1 tablet by mouth 5 (five) times daily.   pramipexole 0.25 MG tablet Commonly known as: MIRAPEX Take 0.25 mg by mouth at bedtime.   Symbicort 160-4.5 MCG/ACT inhaler Generic drug: budesonide-formoterol Inhale 2 puffs into the lungs 2 (two) times daily.   traZODone 100 MG tablet Commonly known as: DESYREL Take 100 mg by mouth at bedtime.        Follow-up [facility]. Follow up.   Contact information: [place] [address] [phone]         Earnie Larsson, MD. Go on 02/23/2022.   Specialty: Neurosurgery Why: First postop appointment is on 02/23/2022 at 3:15 p.m.Marland Kitchen Contact information: [address] [place] [address] [phone]                 Signed: Viona Gilmore, DNP, AGNP-C Nurse Practitioner  [facility] [address], [address], [place], [place] [address] P: [phone]    F: [phone]  02/14/2022, 4:56 PM

## 2022-02-14 NOTE — Anesthesia Postprocedure Evaluation (Signed)
Anesthesia Post Note ? ?Patient: Renee Oliver ? ?Procedure(s) Performed: Anterior Cervical Decompression Fusion  - C3-C4 (Spine Cervical) ? ?  ? ?Patient location during evaluation: PACU ?Anesthesia Type: General ?Level of consciousness: awake and alert ?Pain management: pain level controlled ?Vital Signs Assessment: post-procedure vital signs reviewed and stable ?Respiratory status: spontaneous breathing, nonlabored ventilation, respiratory function stable and patient connected to nasal cannula oxygen ?Cardiovascular status: blood pressure returned to baseline and stable ?Postop Assessment: no apparent nausea or vomiting ?Anesthetic complications: no ? ? ?No notable events documented. ? ?Last Vitals:  ?Vitals:  ? 02/14/22 1042 02/14/22 1604  ?BP: (!) 153/95 (!) 151/70  ?Pulse: 82 71  ?Resp: 18 16  ?Temp:  36.8 ?C  ?SpO2: 97% 96%  ?  ?Last Pain:  ?Vitals:  ? 02/14/22 1000  ?TempSrc:   ?PainSc: Asleep  ? ? ?  ?  ?  ?  ?  ?  ? ?Collene Schlichter ? ? ? ? ?

## 2022-02-14 NOTE — Discharge Instructions (Signed)
Wound Care Keep incision covered and dry for two days.    Do not put any creams, lotions, or ointments on incision. Leave steri-strips on back.  They will fall off by themselves. You are fine to shower. Let water run over incision and pat dry.  Activity Walk each and every day, increasing distance each day. No lifting greater than 5 lbs.  Avoid excessive neck motion. No driving for 2 weeks; may ride as a passenger locally.  Diet Resume your normal diet.   Return to Work Will be discussed at your follow up appointment.  Call Your Doctor If Any of These Occur Redness, drainage, or swelling at the wound.  Temperature greater than 101 degrees. Severe pain not relieved by pain medication. Incision starts to come apart.  Follow Up Appt Call [phone] today for appointment in 2-3 weeks if you don't already have one or for any problems. 

## 2022-02-14 NOTE — Anesthesia Procedure Notes (Signed)
Procedure Name: Intubation ?Date/Time: 02/14/2022 8:06 AM ?Performed by: Lieutenant Diego, CRNA ?Pre-anesthesia Checklist: Patient identified, Emergency Drugs available, Suction available and Patient being monitored ?Patient Re-evaluated:Patient Re-evaluated prior to induction ?Oxygen Delivery Method: Circle system utilized ?Preoxygenation: Pre-oxygenation with 100% oxygen ?Induction Type: IV induction ?Ventilation: Mask ventilation without difficulty ?Laryngoscope Size: Glidescope ?Grade View: Grade I ?Tube type: Oral ?Tube size: 6.5 mm ?Number of attempts: 1 ?Airway Equipment and Method: Stylet and Video-laryngoscopy ?Placement Confirmation: ETT inserted through vocal cords under direct vision, positive ETCO2 and breath sounds checked- equal and bilateral ?Secured at: 19 cm ?Tube secured with: Tape ?Dental Injury: Teeth and Oropharynx as per pre-operative assessment  ? ? ? ? ?

## 2022-02-14 NOTE — Progress Notes (Signed)
Patient alert and oriented, mae's well, voiding adequate amount of urine, swallowing without difficulty, no c/o pain at time of discharge. Patient discharged home with family. Script and discharged instructions given to patient. Patient and family stated understanding of instructions given. Patient has an appointment with Dr. Pool  

## 2022-02-14 NOTE — Transfer of Care (Signed)
Immediate Anesthesia Transfer of Care Note ? ?Patient: [name] ? ?Procedure(s) Performed: Anterior Cervical Decompression Fusion  - C3-C4 (Spine Cervical) ? ?Patient Location: PACU ? ?Anesthesia Type:General ? ?Level of Consciousness: awake ? ?Airway & Oxygen Therapy: Patient Spontanous Breathing and Patient connected to face mask oxygen ? ?Post-op Assessment: Report given to RN and Post -op Vital signs reviewed and stable ? ?Post vital signs: Reviewed and stable ? ?Last Vitals:  ?Vitals Value Taken Time  ?BP 157/108 02/14/22 0929  ?Temp    ?Pulse 92 02/14/22 0930  ?Resp 15 02/14/22 0930  ?SpO2 100 % 02/14/22 0930  ?Vitals shown include unvalidated device data. ? ?Last Pain:  ?Vitals:  ? 02/14/22 0701  ?TempSrc:   ?PainSc: 8   ?   ? ?Patients Stated Pain Goal: 0 (02/14/22 0701) ? ?Complications: No notable events documented. ?

## 2022-02-14 NOTE — Op Note (Signed)
Date of procedure: 02/14/2022 ? ?Date of dictation: Same ? ?Service: Neurosurgery ? 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 ?Postoperative diagnosis: Same ? ?Procedure Name: C3-4 anterior cervical discectomy with interbody fusion utilizing interbody cage, local harvested autograft, morselized allograft, and anterior plate instrumentation ? ?Surgeon:Naysha Sholl A.Kody Vigil, M.D. ? ?Asst. Surgeon: Reinaldo Meeker, NP ? ?Anesthesia: General ? ?Indication: 66 year old female with severe neck pain with associated headaches with radiating pain numbness and paresthesias in both upper extremities.  Work-up demonstrates evidence of severe disc degeneration with marked degenerative anterolisthesis of C3 on C4 with severe stenosis and ongoing cord compression.  Patient presents now for C3-4 anterior cervical decompression and fusion in hopes of improving her symptoms. ? ?Operative note: After induction of anesthesia, patient positioned supine with neck slightly extended and held in place with halter traction.  Patient's anterior cervical region prepped and draped sterilely.  Incision made overlying C3-4.  Dissection performed on the right.  Retractor placed.  X-ray taken.  Level confirmed.  Dissipates in size.  Discectomy performed using various instruments down to level of the posterior annulus.  Microscope then brought to the field used throughout the remainder of the discectomy.  Remaining aspects of annulus and osteophytes removed using high-speed drill.  Posterior logical ligament was then elevated and resected.  Underlying thecal sac was identified.  Wide central decompression then performed by undercutting the bodies of C3 and C4.  Decompression then proceeded each neural foramina.  Wide anterior foraminotomies performed along the course exiting C4 nerve roots bilaterally.  At this point a very thorough decompression of been achieved.  There was no evidence of injury to thecal sac or nerve roots.   Wound was then irrigated.  Gelfoam was placed topically for hemostasis then removed.  6 mm Medtronic anatomic peek cage was then packed with locally harvested autograft and demineralized bone matrix.  This was then impacted in the place and recessed slightly from the anterior cortical margin.  23 mm Atlantis anterior cervical plate was then placed over the C3 and C4 level.  This then attached under fluoroscopic guidance using 14 mm fixed angle screws in a bicortical fashion.  All screws given final tightening.  Locking screws were engaged.  Final images reveal good position of cages and the hardware at the proper operative level with normal alignment of spine.  Wound was then irrigated.  Hemostasis was assured.  Wounds then closed in layers.  Steri-Strips and sterile dressing were applied.  No apparent complications.  Patient tolerated the procedure well and she returns to the recovery room postop. ? ?

## 2022-02-14 NOTE — H&P (Signed)
Renee Oliver is an 66 y.o. female.   ?Chief Complaint: Neck pain ? ?HPI: 66 year old female with progressive worsening cervical pain with associated headaches.  Patient also with some radiating numbness and paresthesias in both upper extremities.  Work-up demonstrates evidence of severe anterolisthesis with deformity at C3-4 with marked cervical stenosis.  Patient with spondylitic changes C4-5 and C5-6 without evidence of significant stenosis.  Patient presents now for C3-4 anterior cervical discectomy and fusion in hopes improving her symptoms. ? ?Past Medical History:  ?Diagnosis Date  ? Anxiety   ? Asthma   ? Chronic back pain   ? COPD (chronic obstructive pulmonary disease) (HCC)   ? COVID 07/2021  ? DDD (degenerative disc disease), lumbar   ? Depression   ? Hypertension   ? Pneumonia   ? septic pneumonia october 2022  ? ? ?Past Surgical History:  ?Procedure Laterality Date  ? BACK SURGERY    ? BILATERAL SALPINGECTOMY    ? CHOLECYSTECTOMY    ? DIAGNOSTIC LAPAROSCOPY    ? DILATATION & CURETTAGE/HYSTEROSCOPY WITH MYOSURE N/A 11/29/2016  ? Procedure: DILATATION & CURETTAGE/HYSTEROSCOPY;  Surgeon: Geryl Rankins, MD;  Location: [facility];  Service: Gynecology;  Laterality: N/A;  Ultrasound Guidance  ? PARTIAL HYSTERECTOMY    ? RIGHT OOPHORECTOMY    ? TONSILLECTOMY    ? ? ?History reviewed. No pertinent family history. ?Social History:  reports that she has been smoking cigarettes. She has a 42.00 pack-year smoking history. She has never used smokeless tobacco. She reports current alcohol use. She reports that she does not use drugs. ? ?Allergies:  ?Allergies  ?Allergen Reactions  ? Amoxicillin Nausea And Vomiting  ?  Can take penicillin but Amoxicillin makes her have upset stomach  ? Aspirin Nausea And Vomiting and Other (See Comments)  ?  Heart palpations ?  ? Celexa [Citalopram] Nausea Only and Other (See Comments)  ?  heartburn  ? Doxycycline Diarrhea and Nausea Only  ? Flexeril [Cyclobenzaprine] Other (See  Comments)  ?  "Makes me feel very strange"/upset stomach  ? Lyrica [Pregabalin] Nausea Only and Other (See Comments)  ?  "Makes me feel strange/weird"  ? Magnesium Diarrhea and Nausea Only  ? Neurontin [Gabapentin] Nausea Only and Other (See Comments)  ?  "Makes me feel very strange/weird"  ? Nsaids Nausea And Vomiting  ? Prednisone Other (See Comments)  ?  Hyper  ?Mental changes  ? Iodinated Contrast Media Swelling and Rash  ?  Throat swelling  ? Tramadol Nausea Only and Anxiety  ?  Insomnia/anorexia/mood changes/hyperactive (injection version per patient)  ? ? ?Medications Prior to Admission  ?Medication Sig Dispense Refill  ? acetaminophen (TYLENOL) 325 MG tablet Take 325-650 mg by mouth every 6 (six) hours as needed (for pain.).    ? albuterol (VENTOLIN HFA) 108 (90 Base) MCG/ACT inhaler Inhale 2 puffs into the lungs every 6 (six) hours as needed for wheezing or shortness of breath. 8 g 1  ? hydrochlorothiazide (HYDRODIURIL) 12.5 MG tablet Take 12.5 mg by mouth in the morning.    ? loperamide (IMODIUM) 2 MG capsule Take 2 mg by mouth 4 (four) times daily as needed for diarrhea or loose stools.    ? losartan (COZAAR) 50 MG tablet Take 50 mg by mouth in the morning.    ? metoprolol succinate (TOPROL-XL) 100 MG 24 hr tablet Take 100 mg by mouth in the morning.    ? oxyCODONE-acetaminophen (PERCOCET) 10-325 MG tablet Take 1 tablet by mouth 5 (five) times  daily.    ? pramipexole (MIRAPEX) 0.25 MG tablet Take 0.25 mg by mouth at bedtime.    ? SYMBICORT 160-4.5 MCG/ACT inhaler Inhale 2 puffs into the lungs 2 (two) times daily.    ? traZODone (DESYREL) 100 MG tablet Take 100 mg by mouth at bedtime.    ? ? ?No results found for this or any previous visit (from the past 48 hour(s)). ?No results found. ? ?Pertinent items noted in HPI and remainder of comprehensive ROS otherwise negative. ? ?Blood pressure (!) 151/96, pulse 88, temperature 98 ?F (36.7 ?C), temperature source Oral, resp. rate 17, height 5\' 3"  (1.6 m), weight  45.4 kg, SpO2 98 %. ? ?Patient is awake and alert.  She is oriented and appropriate.  Speech is fluent.  Judgment insight are intact.  Cranial nerve function normal bilateral motor examination feels some mild weakness of grip and intrinsic strength bilaterally otherwise motor strength intact.  Sensory examination with some decrease sensation to pinprick and light touch distally in both upper extremities.  Reflexes are hyperactive.  Gait is reasonably normal.  Examination head ears eyes nose and throat is unremarked.  Chest and abdomen benign.  Extremities are free from deformity or injury. 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  Plan C3-4 anterior cervical discectomy with interbody fusion utilizing interbody cages, local harvested autograft, and anterior plate instrumentation.  Risks and benefits of been explained.  Patient wishes to proceed. ? ? A Daschel Roughton ?02/14/2022, 7:49 AM ? ? ? ? ?

## 2022-02-14 NOTE — Progress Notes (Signed)
Pharmacy Antibiotic Note ? ?Renee Oliver is a 66 y.o. female admitted on 02/14/2022 with surgical prophylaxis.  Pharmacy has been consulted for vancomycin dosing if penicillin allergic. Per allergy list, patient gets nausea and vomiting when taking penicillins by mouth, which is not a true allergy. ? ?Received vancomycin 1g at 7am. No drain per RN. Note only 45kg with ClCr 49ml/min.  ? ?Plan: ?Cefazolin 1g x1  ? ?Height: 5\' 3"  (160 cm) ?Weight: 45.4 kg (100 lb) ?IBW/kg (Calculated) : 52.4 ? ?Temp (24hrs), Avg:97.5 ?F (36.4 ?C), Min:97.3 ?F (36.3 ?C), Max:98 ?F (36.7 ?C) ? ?Recent Labs  ?Lab 02/08/22 ?1144  ?WBC 10.5  ?CREATININE 0.52  ?  ?Estimated Creatinine Clearance: 50.2 mL/min (by C-G formula based on SCr of 0.52 mg/dL).   ? ?Allergies  ?Allergen Reactions  ? Amoxicillin Nausea And Vomiting  ?  Can take penicillin but Amoxicillin makes her have upset stomach  ? Aspirin Nausea And Vomiting and Other (See Comments)  ?  Heart palpations ?  ? Celexa [Citalopram] Nausea Only and Other (See Comments)  ?  heartburn  ? Doxycycline Diarrhea and Nausea Only  ? Flexeril [Cyclobenzaprine] Other (See Comments)  ?  "Makes me feel very strange"/upset stomach  ? Lyrica [Pregabalin] Nausea Only and Other (See Comments)  ?  "Makes me feel strange/weird"  ? Magnesium Diarrhea and Nausea Only  ? Neurontin [Gabapentin] Nausea Only and Other (See Comments)  ?  "Makes me feel very strange/weird"  ? Nsaids Nausea And Vomiting  ? Prednisone Other (See Comments)  ?  Hyper  ?Mental changes  ? Iodinated Contrast Media Swelling and Rash  ?  Throat swelling  ? Tramadol Nausea Only and Anxiety  ?  Insomnia/anorexia/mood changes/hyperactive (injection version per patient)  ?  ? ?Thank you for allowing pharmacy to be a part of this patient?s care. ? ?04/10/22, PharmD, BCPS, BCCP ?Clinical Pharmacist ? ?Please check AMION for all [facility] phone numbers ?After 10:00 PM, call Main Pharmacy [phone] ? ?

## 2022-02-14 NOTE — TOC CM/SW Note (Signed)
Referral made from MD office to [facility]. ?  ?[facility]  to follow for home health orders.  ?  ?DME to be provided by [facility] staff. ?  ?No other TOC needs identified at this time  ?

## 2022-02-14 NOTE — Anesthesia Preprocedure Evaluation (Addendum)
Anesthesia Evaluation  ?Patient identified by MRN, date of birth, ID band ?Patient awake ? ? ? ?Reviewed: ?Allergy & Precautions, NPO status , Patient's Chart, lab work & pertinent test results, reviewed documented beta blocker date and time  ? ?Airway ?Mallampati: II ? ?TM Distance: >3 FB ?Neck ROM: Limited ? ? ? Dental ? ?(+) Teeth Intact, Dental Advisory Given, Poor Dentition, Partial Upper ?  ?Pulmonary ?asthma , COPD,  COPD inhaler, Current SmokerPatient did not abstain from smoking.,  ?  ? ?+ decreased breath sounds ? ? ? ? ? Cardiovascular ?hypertension, Pt. on home beta blockers and Pt. on medications ?Normal cardiovascular exam ?Rhythm:Regular Rate:Normal ? ? ?  ?Neuro/Psych ?PSYCHIATRIC DISORDERS Anxiety Depression Spondylolisthesis ?  ? GI/Hepatic ?negative GI ROS, Neg liver ROS,   ?Endo/Other  ?negative endocrine ROS ? Renal/GU ?negative Renal ROS  ? ?  ?Musculoskeletal ? ?(+) Arthritis ,  ? Abdominal ?  ?Peds ? Hematology ?negative hematology ROS ?(+)   ?Anesthesia Other Findings ? ? Reproductive/Obstetrics ? ?  ? ? ? ? ? ? ? ? ? ? ? ? ? ?  ?  ? ? ? ? ? ? ? ?Anesthesia Physical ?Anesthesia Plan ? ?ASA: 3 ? ?Anesthesia Plan: General  ? ?Post-op Pain Management: Tylenol PO (pre-op)*  ? ?Induction: Intravenous ? ?PONV Risk Score and Plan: 2 and Dexamethasone, Ondansetron and Midazolam ? ?Airway Management Planned: Oral ETT and Video Laryngoscope Planned ? ?Additional Equipment:  ? ?Intra-op Plan:  ? ?Post-operative Plan: Extubation in OR ? ?Informed Consent: I have reviewed the patients History and Physical, chart, labs and discussed the procedure including the risks, benefits and alternatives for the proposed anesthesia with the patient or authorized representative who has indicated his/her understanding and acceptance.  ? ? ? ?Dental advisory given ? ?Plan Discussed with: CRNA ? ?Anesthesia Plan Comments:   ? ? ? ? ? ?Anesthesia Quick Evaluation ? ?

## 2022-02-16 ENCOUNTER — Encounter (HOSPITAL_COMMUNITY): Payer: Self-pay | Admitting: Neurosurgery

## 2022-03-06 DIAGNOSIS — R030 Elevated blood-pressure reading, without diagnosis of hypertension: Secondary | ICD-10-CM | POA: Diagnosis not present

## 2022-03-06 DIAGNOSIS — M542 Cervicalgia: Secondary | ICD-10-CM | POA: Diagnosis not present

## 2022-03-06 DIAGNOSIS — M47816 Spondylosis without myelopathy or radiculopathy, lumbar region: Secondary | ICD-10-CM | POA: Diagnosis not present

## 2022-03-06 DIAGNOSIS — M961 Postlaminectomy syndrome, not elsewhere classified: Secondary | ICD-10-CM | POA: Diagnosis not present

## 2022-03-06 DIAGNOSIS — Z79899 Other long term (current) drug therapy: Secondary | ICD-10-CM | POA: Diagnosis not present

## 2022-03-06 DIAGNOSIS — G894 Chronic pain syndrome: Secondary | ICD-10-CM | POA: Diagnosis not present

## 2022-03-06 DIAGNOSIS — Z681 Body mass index (BMI) 19 or less, adult: Secondary | ICD-10-CM | POA: Diagnosis not present

## 2022-03-06 DIAGNOSIS — F17200 Nicotine dependence, unspecified, uncomplicated: Secondary | ICD-10-CM | POA: Diagnosis not present

## 2022-03-06 DIAGNOSIS — F17210 Nicotine dependence, cigarettes, uncomplicated: Secondary | ICD-10-CM | POA: Diagnosis not present

## 2022-03-30 DIAGNOSIS — M4802 Spinal stenosis, cervical region: Secondary | ICD-10-CM | POA: Diagnosis not present

## 2022-04-05 DIAGNOSIS — M542 Cervicalgia: Secondary | ICD-10-CM | POA: Diagnosis not present

## 2022-04-05 DIAGNOSIS — M47816 Spondylosis without myelopathy or radiculopathy, lumbar region: Secondary | ICD-10-CM | POA: Diagnosis not present

## 2022-04-05 DIAGNOSIS — F17210 Nicotine dependence, cigarettes, uncomplicated: Secondary | ICD-10-CM | POA: Diagnosis not present

## 2022-04-05 DIAGNOSIS — F17200 Nicotine dependence, unspecified, uncomplicated: Secondary | ICD-10-CM | POA: Diagnosis not present

## 2022-04-05 DIAGNOSIS — Z79899 Other long term (current) drug therapy: Secondary | ICD-10-CM | POA: Diagnosis not present

## 2022-04-05 DIAGNOSIS — R030 Elevated blood-pressure reading, without diagnosis of hypertension: Secondary | ICD-10-CM | POA: Diagnosis not present

## 2022-04-05 DIAGNOSIS — G894 Chronic pain syndrome: Secondary | ICD-10-CM | POA: Diagnosis not present

## 2022-04-05 DIAGNOSIS — M961 Postlaminectomy syndrome, not elsewhere classified: Secondary | ICD-10-CM | POA: Diagnosis not present

## 2022-04-05 DIAGNOSIS — Z681 Body mass index (BMI) 19 or less, adult: Secondary | ICD-10-CM | POA: Diagnosis not present

## 2022-05-04 DIAGNOSIS — M961 Postlaminectomy syndrome, not elsewhere classified: Secondary | ICD-10-CM | POA: Diagnosis not present

## 2022-05-04 DIAGNOSIS — G894 Chronic pain syndrome: Secondary | ICD-10-CM | POA: Diagnosis not present

## 2022-05-04 DIAGNOSIS — Z681 Body mass index (BMI) 19 or less, adult: Secondary | ICD-10-CM | POA: Diagnosis not present

## 2022-05-04 DIAGNOSIS — F17200 Nicotine dependence, unspecified, uncomplicated: Secondary | ICD-10-CM | POA: Diagnosis not present

## 2022-05-04 DIAGNOSIS — R030 Elevated blood-pressure reading, without diagnosis of hypertension: Secondary | ICD-10-CM | POA: Diagnosis not present

## 2022-05-04 DIAGNOSIS — M47816 Spondylosis without myelopathy or radiculopathy, lumbar region: Secondary | ICD-10-CM | POA: Diagnosis not present

## 2022-05-04 DIAGNOSIS — Z79899 Other long term (current) drug therapy: Secondary | ICD-10-CM | POA: Diagnosis not present

## 2022-05-04 DIAGNOSIS — M542 Cervicalgia: Secondary | ICD-10-CM | POA: Diagnosis not present

## 2022-05-04 DIAGNOSIS — F17210 Nicotine dependence, cigarettes, uncomplicated: Secondary | ICD-10-CM | POA: Diagnosis not present

## 2022-05-09 DIAGNOSIS — Z79899 Other long term (current) drug therapy: Secondary | ICD-10-CM | POA: Diagnosis not present

## 2022-05-11 DIAGNOSIS — M4802 Spinal stenosis, cervical region: Secondary | ICD-10-CM | POA: Diagnosis not present

## 2022-06-02 DIAGNOSIS — G894 Chronic pain syndrome: Secondary | ICD-10-CM | POA: Diagnosis not present

## 2022-06-02 DIAGNOSIS — M542 Cervicalgia: Secondary | ICD-10-CM | POA: Diagnosis not present

## 2022-06-02 DIAGNOSIS — M47816 Spondylosis without myelopathy or radiculopathy, lumbar region: Secondary | ICD-10-CM | POA: Diagnosis not present

## 2022-06-02 DIAGNOSIS — R030 Elevated blood-pressure reading, without diagnosis of hypertension: Secondary | ICD-10-CM | POA: Diagnosis not present

## 2022-06-02 DIAGNOSIS — M961 Postlaminectomy syndrome, not elsewhere classified: Secondary | ICD-10-CM | POA: Diagnosis not present

## 2022-06-02 DIAGNOSIS — Z681 Body mass index (BMI) 19 or less, adult: Secondary | ICD-10-CM | POA: Diagnosis not present

## 2022-06-02 DIAGNOSIS — F17200 Nicotine dependence, unspecified, uncomplicated: Secondary | ICD-10-CM | POA: Diagnosis not present

## 2022-06-02 DIAGNOSIS — F17210 Nicotine dependence, cigarettes, uncomplicated: Secondary | ICD-10-CM | POA: Diagnosis not present

## 2022-06-02 DIAGNOSIS — Z79899 Other long term (current) drug therapy: Secondary | ICD-10-CM | POA: Diagnosis not present

## 2022-06-16 DIAGNOSIS — Z122 Encounter for screening for malignant neoplasm of respiratory organs: Secondary | ICD-10-CM | POA: Diagnosis not present

## 2022-06-16 DIAGNOSIS — Z13820 Encounter for screening for osteoporosis: Secondary | ICD-10-CM | POA: Diagnosis not present

## 2022-06-16 DIAGNOSIS — I10 Essential (primary) hypertension: Secondary | ICD-10-CM | POA: Diagnosis not present

## 2022-06-16 DIAGNOSIS — E782 Mixed hyperlipidemia: Secondary | ICD-10-CM | POA: Diagnosis not present

## 2022-06-16 DIAGNOSIS — Z0000 Encounter for general adult medical examination without abnormal findings: Secondary | ICD-10-CM | POA: Diagnosis not present

## 2022-06-16 DIAGNOSIS — Z1211 Encounter for screening for malignant neoplasm of colon: Secondary | ICD-10-CM | POA: Diagnosis not present

## 2022-06-16 DIAGNOSIS — Z23 Encounter for immunization: Secondary | ICD-10-CM | POA: Diagnosis not present

## 2022-06-16 DIAGNOSIS — M5106 Intervertebral disc disorders with myelopathy, lumbar region: Secondary | ICD-10-CM | POA: Diagnosis not present

## 2022-06-22 ENCOUNTER — Other Ambulatory Visit (HOSPITAL_BASED_OUTPATIENT_CLINIC_OR_DEPARTMENT_OTHER): Payer: Self-pay | Admitting: Family Medicine

## 2022-06-22 ENCOUNTER — Telehealth (HOSPITAL_BASED_OUTPATIENT_CLINIC_OR_DEPARTMENT_OTHER): Payer: Self-pay

## 2022-06-22 DIAGNOSIS — F17200 Nicotine dependence, unspecified, uncomplicated: Secondary | ICD-10-CM

## 2022-07-06 DIAGNOSIS — Z681 Body mass index (BMI) 19 or less, adult: Secondary | ICD-10-CM | POA: Diagnosis not present

## 2022-07-06 DIAGNOSIS — R030 Elevated blood-pressure reading, without diagnosis of hypertension: Secondary | ICD-10-CM | POA: Diagnosis not present

## 2022-07-06 DIAGNOSIS — Z79899 Other long term (current) drug therapy: Secondary | ICD-10-CM | POA: Diagnosis not present

## 2022-07-06 DIAGNOSIS — M542 Cervicalgia: Secondary | ICD-10-CM | POA: Diagnosis not present

## 2022-07-06 DIAGNOSIS — M961 Postlaminectomy syndrome, not elsewhere classified: Secondary | ICD-10-CM | POA: Diagnosis not present

## 2022-07-06 DIAGNOSIS — G894 Chronic pain syndrome: Secondary | ICD-10-CM | POA: Diagnosis not present

## 2022-07-06 DIAGNOSIS — M47816 Spondylosis without myelopathy or radiculopathy, lumbar region: Secondary | ICD-10-CM | POA: Diagnosis not present

## 2022-08-04 DIAGNOSIS — Z79899 Other long term (current) drug therapy: Secondary | ICD-10-CM | POA: Diagnosis not present

## 2022-08-04 DIAGNOSIS — Z681 Body mass index (BMI) 19 or less, adult: Secondary | ICD-10-CM | POA: Diagnosis not present

## 2022-08-04 DIAGNOSIS — R030 Elevated blood-pressure reading, without diagnosis of hypertension: Secondary | ICD-10-CM | POA: Diagnosis not present

## 2022-08-04 DIAGNOSIS — G894 Chronic pain syndrome: Secondary | ICD-10-CM | POA: Diagnosis not present

## 2022-08-04 DIAGNOSIS — M542 Cervicalgia: Secondary | ICD-10-CM | POA: Diagnosis not present

## 2022-08-04 DIAGNOSIS — M961 Postlaminectomy syndrome, not elsewhere classified: Secondary | ICD-10-CM | POA: Diagnosis not present

## 2022-08-04 DIAGNOSIS — M47816 Spondylosis without myelopathy or radiculopathy, lumbar region: Secondary | ICD-10-CM | POA: Diagnosis not present

## 2022-08-10 DIAGNOSIS — M4802 Spinal stenosis, cervical region: Secondary | ICD-10-CM | POA: Diagnosis not present

## 2022-09-01 DIAGNOSIS — Z79899 Other long term (current) drug therapy: Secondary | ICD-10-CM | POA: Diagnosis not present

## 2022-09-01 DIAGNOSIS — M47816 Spondylosis without myelopathy or radiculopathy, lumbar region: Secondary | ICD-10-CM | POA: Diagnosis not present

## 2022-09-01 DIAGNOSIS — M961 Postlaminectomy syndrome, not elsewhere classified: Secondary | ICD-10-CM | POA: Diagnosis not present

## 2022-09-01 DIAGNOSIS — G894 Chronic pain syndrome: Secondary | ICD-10-CM | POA: Diagnosis not present

## 2022-09-01 DIAGNOSIS — Z681 Body mass index (BMI) 19 or less, adult: Secondary | ICD-10-CM | POA: Diagnosis not present

## 2022-09-01 DIAGNOSIS — M542 Cervicalgia: Secondary | ICD-10-CM | POA: Diagnosis not present

## 2022-09-01 DIAGNOSIS — R030 Elevated blood-pressure reading, without diagnosis of hypertension: Secondary | ICD-10-CM | POA: Diagnosis not present

## 2022-09-29 DIAGNOSIS — M47816 Spondylosis without myelopathy or radiculopathy, lumbar region: Secondary | ICD-10-CM | POA: Diagnosis not present

## 2022-09-29 DIAGNOSIS — G894 Chronic pain syndrome: Secondary | ICD-10-CM | POA: Diagnosis not present

## 2022-09-29 DIAGNOSIS — M961 Postlaminectomy syndrome, not elsewhere classified: Secondary | ICD-10-CM | POA: Diagnosis not present

## 2022-09-29 DIAGNOSIS — Z79899 Other long term (current) drug therapy: Secondary | ICD-10-CM | POA: Diagnosis not present

## 2022-09-29 DIAGNOSIS — Z681 Body mass index (BMI) 19 or less, adult: Secondary | ICD-10-CM | POA: Diagnosis not present

## 2022-09-29 DIAGNOSIS — R030 Elevated blood-pressure reading, without diagnosis of hypertension: Secondary | ICD-10-CM | POA: Diagnosis not present

## 2022-09-29 DIAGNOSIS — M542 Cervicalgia: Secondary | ICD-10-CM | POA: Diagnosis not present

## 2022-10-31 DIAGNOSIS — R030 Elevated blood-pressure reading, without diagnosis of hypertension: Secondary | ICD-10-CM | POA: Diagnosis not present

## 2022-10-31 DIAGNOSIS — M961 Postlaminectomy syndrome, not elsewhere classified: Secondary | ICD-10-CM | POA: Diagnosis not present

## 2022-10-31 DIAGNOSIS — M542 Cervicalgia: Secondary | ICD-10-CM | POA: Diagnosis not present

## 2022-10-31 DIAGNOSIS — Z79899 Other long term (current) drug therapy: Secondary | ICD-10-CM | POA: Diagnosis not present

## 2022-10-31 DIAGNOSIS — G894 Chronic pain syndrome: Secondary | ICD-10-CM | POA: Diagnosis not present

## 2022-10-31 DIAGNOSIS — M47816 Spondylosis without myelopathy or radiculopathy, lumbar region: Secondary | ICD-10-CM | POA: Diagnosis not present

## 2022-10-31 DIAGNOSIS — Z681 Body mass index (BMI) 19 or less, adult: Secondary | ICD-10-CM | POA: Diagnosis not present

## 2022-10-31 DIAGNOSIS — F17200 Nicotine dependence, unspecified, uncomplicated: Secondary | ICD-10-CM | POA: Diagnosis not present

## 2022-10-31 DIAGNOSIS — F17210 Nicotine dependence, cigarettes, uncomplicated: Secondary | ICD-10-CM | POA: Diagnosis not present

## 2022-11-29 DIAGNOSIS — M47816 Spondylosis without myelopathy or radiculopathy, lumbar region: Secondary | ICD-10-CM | POA: Diagnosis not present

## 2022-11-29 DIAGNOSIS — F17210 Nicotine dependence, cigarettes, uncomplicated: Secondary | ICD-10-CM | POA: Diagnosis not present

## 2022-11-29 DIAGNOSIS — M961 Postlaminectomy syndrome, not elsewhere classified: Secondary | ICD-10-CM | POA: Diagnosis not present

## 2022-11-29 DIAGNOSIS — M542 Cervicalgia: Secondary | ICD-10-CM | POA: Diagnosis not present

## 2022-11-29 DIAGNOSIS — G894 Chronic pain syndrome: Secondary | ICD-10-CM | POA: Diagnosis not present

## 2022-11-29 DIAGNOSIS — Z79899 Other long term (current) drug therapy: Secondary | ICD-10-CM | POA: Diagnosis not present

## 2022-11-29 DIAGNOSIS — Z681 Body mass index (BMI) 19 or less, adult: Secondary | ICD-10-CM | POA: Diagnosis not present

## 2022-11-29 DIAGNOSIS — R030 Elevated blood-pressure reading, without diagnosis of hypertension: Secondary | ICD-10-CM | POA: Diagnosis not present

## 2022-11-29 DIAGNOSIS — F17200 Nicotine dependence, unspecified, uncomplicated: Secondary | ICD-10-CM | POA: Diagnosis not present

## 2022-12-28 DIAGNOSIS — F17210 Nicotine dependence, cigarettes, uncomplicated: Secondary | ICD-10-CM | POA: Diagnosis not present

## 2022-12-28 DIAGNOSIS — R030 Elevated blood-pressure reading, without diagnosis of hypertension: Secondary | ICD-10-CM | POA: Diagnosis not present

## 2022-12-28 DIAGNOSIS — F17200 Nicotine dependence, unspecified, uncomplicated: Secondary | ICD-10-CM | POA: Diagnosis not present

## 2022-12-28 DIAGNOSIS — G894 Chronic pain syndrome: Secondary | ICD-10-CM | POA: Diagnosis not present

## 2022-12-28 DIAGNOSIS — Z681 Body mass index (BMI) 19 or less, adult: Secondary | ICD-10-CM | POA: Diagnosis not present

## 2022-12-28 DIAGNOSIS — M961 Postlaminectomy syndrome, not elsewhere classified: Secondary | ICD-10-CM | POA: Diagnosis not present

## 2022-12-28 DIAGNOSIS — M47816 Spondylosis without myelopathy or radiculopathy, lumbar region: Secondary | ICD-10-CM | POA: Diagnosis not present

## 2022-12-28 DIAGNOSIS — Z79899 Other long term (current) drug therapy: Secondary | ICD-10-CM | POA: Diagnosis not present

## 2022-12-28 DIAGNOSIS — M542 Cervicalgia: Secondary | ICD-10-CM | POA: Diagnosis not present

## 2022-12-28 DIAGNOSIS — R059 Cough, unspecified: Secondary | ICD-10-CM | POA: Diagnosis not present

## 2023-01-26 DIAGNOSIS — F17210 Nicotine dependence, cigarettes, uncomplicated: Secondary | ICD-10-CM | POA: Diagnosis not present

## 2023-01-26 DIAGNOSIS — E559 Vitamin D deficiency, unspecified: Secondary | ICD-10-CM | POA: Diagnosis not present

## 2023-01-26 DIAGNOSIS — M542 Cervicalgia: Secondary | ICD-10-CM | POA: Diagnosis not present

## 2023-01-26 DIAGNOSIS — R059 Cough, unspecified: Secondary | ICD-10-CM | POA: Diagnosis not present

## 2023-01-26 DIAGNOSIS — Z681 Body mass index (BMI) 19 or less, adult: Secondary | ICD-10-CM | POA: Diagnosis not present

## 2023-01-26 DIAGNOSIS — R0602 Shortness of breath: Secondary | ICD-10-CM | POA: Diagnosis not present

## 2023-01-26 DIAGNOSIS — G894 Chronic pain syndrome: Secondary | ICD-10-CM | POA: Diagnosis not present

## 2023-01-26 DIAGNOSIS — J449 Chronic obstructive pulmonary disease, unspecified: Secondary | ICD-10-CM | POA: Diagnosis not present

## 2023-01-26 DIAGNOSIS — M961 Postlaminectomy syndrome, not elsewhere classified: Secondary | ICD-10-CM | POA: Diagnosis not present

## 2023-01-26 DIAGNOSIS — M47816 Spondylosis without myelopathy or radiculopathy, lumbar region: Secondary | ICD-10-CM | POA: Diagnosis not present

## 2023-01-26 DIAGNOSIS — F17200 Nicotine dependence, unspecified, uncomplicated: Secondary | ICD-10-CM | POA: Diagnosis not present

## 2023-01-29 DIAGNOSIS — J441 Chronic obstructive pulmonary disease with (acute) exacerbation: Secondary | ICD-10-CM | POA: Diagnosis not present

## 2023-02-23 DIAGNOSIS — Z79899 Other long term (current) drug therapy: Secondary | ICD-10-CM | POA: Diagnosis not present

## 2023-02-23 DIAGNOSIS — M47816 Spondylosis without myelopathy or radiculopathy, lumbar region: Secondary | ICD-10-CM | POA: Diagnosis not present

## 2023-02-23 DIAGNOSIS — F17210 Nicotine dependence, cigarettes, uncomplicated: Secondary | ICD-10-CM | POA: Diagnosis not present

## 2023-02-23 DIAGNOSIS — Z681 Body mass index (BMI) 19 or less, adult: Secondary | ICD-10-CM | POA: Diagnosis not present

## 2023-02-23 DIAGNOSIS — E559 Vitamin D deficiency, unspecified: Secondary | ICD-10-CM | POA: Diagnosis not present

## 2023-02-23 DIAGNOSIS — F17200 Nicotine dependence, unspecified, uncomplicated: Secondary | ICD-10-CM | POA: Diagnosis not present

## 2023-02-23 DIAGNOSIS — J449 Chronic obstructive pulmonary disease, unspecified: Secondary | ICD-10-CM | POA: Diagnosis not present

## 2023-02-23 DIAGNOSIS — G894 Chronic pain syndrome: Secondary | ICD-10-CM | POA: Diagnosis not present

## 2023-02-23 DIAGNOSIS — M542 Cervicalgia: Secondary | ICD-10-CM | POA: Diagnosis not present

## 2023-02-23 DIAGNOSIS — R059 Cough, unspecified: Secondary | ICD-10-CM | POA: Diagnosis not present

## 2023-02-23 DIAGNOSIS — M961 Postlaminectomy syndrome, not elsewhere classified: Secondary | ICD-10-CM | POA: Diagnosis not present

## 2023-03-01 ENCOUNTER — Ambulatory Visit: Payer: 59 | Admitting: Podiatry

## 2023-03-28 DIAGNOSIS — F17200 Nicotine dependence, unspecified, uncomplicated: Secondary | ICD-10-CM | POA: Diagnosis not present

## 2023-03-28 DIAGNOSIS — G894 Chronic pain syndrome: Secondary | ICD-10-CM | POA: Diagnosis not present

## 2023-03-28 DIAGNOSIS — M47816 Spondylosis without myelopathy or radiculopathy, lumbar region: Secondary | ICD-10-CM | POA: Diagnosis not present

## 2023-03-28 DIAGNOSIS — M542 Cervicalgia: Secondary | ICD-10-CM | POA: Diagnosis not present

## 2023-03-28 DIAGNOSIS — J449 Chronic obstructive pulmonary disease, unspecified: Secondary | ICD-10-CM | POA: Diagnosis not present

## 2023-03-28 DIAGNOSIS — Z681 Body mass index (BMI) 19 or less, adult: Secondary | ICD-10-CM | POA: Diagnosis not present

## 2023-03-28 DIAGNOSIS — F17210 Nicotine dependence, cigarettes, uncomplicated: Secondary | ICD-10-CM | POA: Diagnosis not present

## 2023-03-28 DIAGNOSIS — M961 Postlaminectomy syndrome, not elsewhere classified: Secondary | ICD-10-CM | POA: Diagnosis not present

## 2023-03-28 DIAGNOSIS — E559 Vitamin D deficiency, unspecified: Secondary | ICD-10-CM | POA: Diagnosis not present

## 2023-03-28 DIAGNOSIS — Z79899 Other long term (current) drug therapy: Secondary | ICD-10-CM | POA: Diagnosis not present

## 2023-04-26 DIAGNOSIS — G894 Chronic pain syndrome: Secondary | ICD-10-CM | POA: Diagnosis not present

## 2023-04-26 DIAGNOSIS — M961 Postlaminectomy syndrome, not elsewhere classified: Secondary | ICD-10-CM | POA: Diagnosis not present

## 2023-04-26 DIAGNOSIS — M542 Cervicalgia: Secondary | ICD-10-CM | POA: Diagnosis not present

## 2023-04-26 DIAGNOSIS — F17200 Nicotine dependence, unspecified, uncomplicated: Secondary | ICD-10-CM | POA: Diagnosis not present

## 2023-04-26 DIAGNOSIS — Z79899 Other long term (current) drug therapy: Secondary | ICD-10-CM | POA: Diagnosis not present

## 2023-04-26 DIAGNOSIS — Z681 Body mass index (BMI) 19 or less, adult: Secondary | ICD-10-CM | POA: Diagnosis not present

## 2023-04-26 DIAGNOSIS — M47816 Spondylosis without myelopathy or radiculopathy, lumbar region: Secondary | ICD-10-CM | POA: Diagnosis not present

## 2023-04-26 DIAGNOSIS — F17210 Nicotine dependence, cigarettes, uncomplicated: Secondary | ICD-10-CM | POA: Diagnosis not present

## 2023-04-26 DIAGNOSIS — E559 Vitamin D deficiency, unspecified: Secondary | ICD-10-CM | POA: Diagnosis not present

## 2023-04-26 DIAGNOSIS — J449 Chronic obstructive pulmonary disease, unspecified: Secondary | ICD-10-CM | POA: Diagnosis not present

## 2023-05-10 DIAGNOSIS — Z681 Body mass index (BMI) 19 or less, adult: Secondary | ICD-10-CM | POA: Diagnosis not present

## 2023-05-10 DIAGNOSIS — M4802 Spinal stenosis, cervical region: Secondary | ICD-10-CM | POA: Diagnosis not present

## 2023-05-25 DIAGNOSIS — Z681 Body mass index (BMI) 19 or less, adult: Secondary | ICD-10-CM | POA: Diagnosis not present

## 2023-05-25 DIAGNOSIS — R059 Cough, unspecified: Secondary | ICD-10-CM | POA: Diagnosis not present

## 2023-05-25 DIAGNOSIS — Z79899 Other long term (current) drug therapy: Secondary | ICD-10-CM | POA: Diagnosis not present

## 2023-05-25 DIAGNOSIS — F17200 Nicotine dependence, unspecified, uncomplicated: Secondary | ICD-10-CM | POA: Diagnosis not present

## 2023-05-25 DIAGNOSIS — M47816 Spondylosis without myelopathy or radiculopathy, lumbar region: Secondary | ICD-10-CM | POA: Diagnosis not present

## 2023-05-25 DIAGNOSIS — E559 Vitamin D deficiency, unspecified: Secondary | ICD-10-CM | POA: Diagnosis not present

## 2023-05-25 DIAGNOSIS — G894 Chronic pain syndrome: Secondary | ICD-10-CM | POA: Diagnosis not present

## 2023-05-25 DIAGNOSIS — J449 Chronic obstructive pulmonary disease, unspecified: Secondary | ICD-10-CM | POA: Diagnosis not present

## 2023-05-25 DIAGNOSIS — M542 Cervicalgia: Secondary | ICD-10-CM | POA: Diagnosis not present

## 2023-05-25 DIAGNOSIS — M961 Postlaminectomy syndrome, not elsewhere classified: Secondary | ICD-10-CM | POA: Diagnosis not present

## 2023-05-25 DIAGNOSIS — F17210 Nicotine dependence, cigarettes, uncomplicated: Secondary | ICD-10-CM | POA: Diagnosis not present

## 2023-06-18 DIAGNOSIS — H43313 Vitreous membranes and strands, bilateral: Secondary | ICD-10-CM | POA: Diagnosis not present

## 2023-06-18 DIAGNOSIS — H2513 Age-related nuclear cataract, bilateral: Secondary | ICD-10-CM | POA: Diagnosis not present

## 2023-06-21 DIAGNOSIS — E782 Mixed hyperlipidemia: Secondary | ICD-10-CM | POA: Diagnosis not present

## 2023-06-21 DIAGNOSIS — Z122 Encounter for screening for malignant neoplasm of respiratory organs: Secondary | ICD-10-CM | POA: Diagnosis not present

## 2023-06-21 DIAGNOSIS — Z1211 Encounter for screening for malignant neoplasm of colon: Secondary | ICD-10-CM | POA: Diagnosis not present

## 2023-06-21 DIAGNOSIS — Z13820 Encounter for screening for osteoporosis: Secondary | ICD-10-CM | POA: Diagnosis not present

## 2023-06-21 DIAGNOSIS — M5106 Intervertebral disc disorders with myelopathy, lumbar region: Secondary | ICD-10-CM | POA: Diagnosis not present

## 2023-06-21 DIAGNOSIS — F17210 Nicotine dependence, cigarettes, uncomplicated: Secondary | ICD-10-CM | POA: Diagnosis not present

## 2023-06-21 DIAGNOSIS — I10 Essential (primary) hypertension: Secondary | ICD-10-CM | POA: Diagnosis not present

## 2023-06-21 DIAGNOSIS — Z1239 Encounter for other screening for malignant neoplasm of breast: Secondary | ICD-10-CM | POA: Diagnosis not present

## 2023-06-21 DIAGNOSIS — Z0000 Encounter for general adult medical examination without abnormal findings: Secondary | ICD-10-CM | POA: Diagnosis not present

## 2023-06-23 DIAGNOSIS — E559 Vitamin D deficiency, unspecified: Secondary | ICD-10-CM | POA: Diagnosis not present

## 2023-06-23 DIAGNOSIS — M47816 Spondylosis without myelopathy or radiculopathy, lumbar region: Secondary | ICD-10-CM | POA: Diagnosis not present

## 2023-06-23 DIAGNOSIS — M542 Cervicalgia: Secondary | ICD-10-CM | POA: Diagnosis not present

## 2023-06-23 DIAGNOSIS — F17200 Nicotine dependence, unspecified, uncomplicated: Secondary | ICD-10-CM | POA: Diagnosis not present

## 2023-06-23 DIAGNOSIS — Z9181 History of falling: Secondary | ICD-10-CM | POA: Diagnosis not present

## 2023-06-23 DIAGNOSIS — J449 Chronic obstructive pulmonary disease, unspecified: Secondary | ICD-10-CM | POA: Diagnosis not present

## 2023-06-23 DIAGNOSIS — R0602 Shortness of breath: Secondary | ICD-10-CM | POA: Diagnosis not present

## 2023-06-23 DIAGNOSIS — M961 Postlaminectomy syndrome, not elsewhere classified: Secondary | ICD-10-CM | POA: Diagnosis not present

## 2023-06-23 DIAGNOSIS — G894 Chronic pain syndrome: Secondary | ICD-10-CM | POA: Diagnosis not present

## 2023-06-23 DIAGNOSIS — Z79899 Other long term (current) drug therapy: Secondary | ICD-10-CM | POA: Diagnosis not present

## 2023-06-23 DIAGNOSIS — Z681 Body mass index (BMI) 19 or less, adult: Secondary | ICD-10-CM | POA: Diagnosis not present

## 2023-06-23 DIAGNOSIS — F17210 Nicotine dependence, cigarettes, uncomplicated: Secondary | ICD-10-CM | POA: Diagnosis not present

## 2023-06-26 ENCOUNTER — Other Ambulatory Visit: Payer: Self-pay | Admitting: Family Medicine

## 2023-06-26 DIAGNOSIS — M6281 Muscle weakness (generalized): Secondary | ICD-10-CM | POA: Diagnosis not present

## 2023-06-26 DIAGNOSIS — M62838 Other muscle spasm: Secondary | ICD-10-CM | POA: Diagnosis not present

## 2023-06-26 DIAGNOSIS — M4802 Spinal stenosis, cervical region: Secondary | ICD-10-CM | POA: Diagnosis not present

## 2023-06-26 DIAGNOSIS — F17210 Nicotine dependence, cigarettes, uncomplicated: Secondary | ICD-10-CM

## 2023-06-27 ENCOUNTER — Other Ambulatory Visit: Payer: Self-pay | Admitting: Family Medicine

## 2023-06-27 DIAGNOSIS — Z1231 Encounter for screening mammogram for malignant neoplasm of breast: Secondary | ICD-10-CM

## 2023-06-28 DIAGNOSIS — Z79899 Other long term (current) drug therapy: Secondary | ICD-10-CM | POA: Diagnosis not present

## 2023-06-30 ENCOUNTER — Other Ambulatory Visit: Payer: Self-pay | Admitting: Family Medicine

## 2023-06-30 DIAGNOSIS — E2839 Other primary ovarian failure: Secondary | ICD-10-CM

## 2023-07-21 DIAGNOSIS — M542 Cervicalgia: Secondary | ICD-10-CM | POA: Diagnosis not present

## 2023-07-21 DIAGNOSIS — F17200 Nicotine dependence, unspecified, uncomplicated: Secondary | ICD-10-CM | POA: Diagnosis not present

## 2023-07-21 DIAGNOSIS — J449 Chronic obstructive pulmonary disease, unspecified: Secondary | ICD-10-CM | POA: Diagnosis not present

## 2023-07-21 DIAGNOSIS — E559 Vitamin D deficiency, unspecified: Secondary | ICD-10-CM | POA: Diagnosis not present

## 2023-07-21 DIAGNOSIS — Z9181 History of falling: Secondary | ICD-10-CM | POA: Diagnosis not present

## 2023-07-21 DIAGNOSIS — M961 Postlaminectomy syndrome, not elsewhere classified: Secondary | ICD-10-CM | POA: Diagnosis not present

## 2023-07-21 DIAGNOSIS — G894 Chronic pain syndrome: Secondary | ICD-10-CM | POA: Diagnosis not present

## 2023-07-21 DIAGNOSIS — M47816 Spondylosis without myelopathy or radiculopathy, lumbar region: Secondary | ICD-10-CM | POA: Diagnosis not present

## 2023-07-21 DIAGNOSIS — Z681 Body mass index (BMI) 19 or less, adult: Secondary | ICD-10-CM | POA: Diagnosis not present

## 2023-07-21 DIAGNOSIS — Z79899 Other long term (current) drug therapy: Secondary | ICD-10-CM | POA: Diagnosis not present

## 2023-07-25 ENCOUNTER — Ambulatory Visit: Payer: 59

## 2023-07-25 DIAGNOSIS — Z122 Encounter for screening for malignant neoplasm of respiratory organs: Secondary | ICD-10-CM

## 2023-07-25 DIAGNOSIS — F17210 Nicotine dependence, cigarettes, uncomplicated: Secondary | ICD-10-CM

## 2023-07-25 DIAGNOSIS — Z1231 Encounter for screening mammogram for malignant neoplasm of breast: Secondary | ICD-10-CM

## 2023-07-26 DIAGNOSIS — Z79899 Other long term (current) drug therapy: Secondary | ICD-10-CM | POA: Diagnosis not present

## 2023-08-21 DIAGNOSIS — G894 Chronic pain syndrome: Secondary | ICD-10-CM | POA: Diagnosis not present

## 2023-08-21 DIAGNOSIS — Z681 Body mass index (BMI) 19 or less, adult: Secondary | ICD-10-CM | POA: Diagnosis not present

## 2023-08-21 DIAGNOSIS — Z79899 Other long term (current) drug therapy: Secondary | ICD-10-CM | POA: Diagnosis not present

## 2023-08-21 DIAGNOSIS — M961 Postlaminectomy syndrome, not elsewhere classified: Secondary | ICD-10-CM | POA: Diagnosis not present

## 2023-08-21 DIAGNOSIS — M542 Cervicalgia: Secondary | ICD-10-CM | POA: Diagnosis not present

## 2023-08-21 DIAGNOSIS — F17200 Nicotine dependence, unspecified, uncomplicated: Secondary | ICD-10-CM | POA: Diagnosis not present

## 2023-08-21 DIAGNOSIS — M47816 Spondylosis without myelopathy or radiculopathy, lumbar region: Secondary | ICD-10-CM | POA: Diagnosis not present

## 2023-08-21 DIAGNOSIS — Z9181 History of falling: Secondary | ICD-10-CM | POA: Diagnosis not present

## 2023-08-24 DIAGNOSIS — Z79899 Other long term (current) drug therapy: Secondary | ICD-10-CM | POA: Diagnosis not present

## 2023-09-03 DIAGNOSIS — R918 Other nonspecific abnormal finding of lung field: Secondary | ICD-10-CM | POA: Diagnosis not present

## 2023-09-03 DIAGNOSIS — F17210 Nicotine dependence, cigarettes, uncomplicated: Secondary | ICD-10-CM | POA: Diagnosis not present

## 2023-09-20 DIAGNOSIS — Z681 Body mass index (BMI) 19 or less, adult: Secondary | ICD-10-CM | POA: Diagnosis not present

## 2023-09-20 DIAGNOSIS — F17210 Nicotine dependence, cigarettes, uncomplicated: Secondary | ICD-10-CM | POA: Diagnosis not present

## 2023-09-20 DIAGNOSIS — M542 Cervicalgia: Secondary | ICD-10-CM | POA: Diagnosis not present

## 2023-09-20 DIAGNOSIS — M961 Postlaminectomy syndrome, not elsewhere classified: Secondary | ICD-10-CM | POA: Diagnosis not present

## 2023-09-20 DIAGNOSIS — Z9181 History of falling: Secondary | ICD-10-CM | POA: Diagnosis not present

## 2023-09-20 DIAGNOSIS — M47816 Spondylosis without myelopathy or radiculopathy, lumbar region: Secondary | ICD-10-CM | POA: Diagnosis not present

## 2023-09-20 DIAGNOSIS — Z79899 Other long term (current) drug therapy: Secondary | ICD-10-CM | POA: Diagnosis not present

## 2023-10-18 DIAGNOSIS — F17200 Nicotine dependence, unspecified, uncomplicated: Secondary | ICD-10-CM | POA: Diagnosis not present

## 2023-10-18 DIAGNOSIS — Z9181 History of falling: Secondary | ICD-10-CM | POA: Diagnosis not present

## 2023-10-18 DIAGNOSIS — M961 Postlaminectomy syndrome, not elsewhere classified: Secondary | ICD-10-CM | POA: Diagnosis not present

## 2023-10-18 DIAGNOSIS — M47816 Spondylosis without myelopathy or radiculopathy, lumbar region: Secondary | ICD-10-CM | POA: Diagnosis not present

## 2023-10-18 DIAGNOSIS — F17210 Nicotine dependence, cigarettes, uncomplicated: Secondary | ICD-10-CM | POA: Diagnosis not present

## 2023-10-18 DIAGNOSIS — M542 Cervicalgia: Secondary | ICD-10-CM | POA: Diagnosis not present

## 2023-10-18 DIAGNOSIS — Z79899 Other long term (current) drug therapy: Secondary | ICD-10-CM | POA: Diagnosis not present

## 2023-10-18 DIAGNOSIS — E559 Vitamin D deficiency, unspecified: Secondary | ICD-10-CM | POA: Diagnosis not present

## 2023-10-18 DIAGNOSIS — Z681 Body mass index (BMI) 19 or less, adult: Secondary | ICD-10-CM | POA: Diagnosis not present

## 2023-10-18 DIAGNOSIS — Z0000 Encounter for general adult medical examination without abnormal findings: Secondary | ICD-10-CM | POA: Diagnosis not present

## 2023-10-29 ENCOUNTER — Other Ambulatory Visit (HOSPITAL_BASED_OUTPATIENT_CLINIC_OR_DEPARTMENT_OTHER): Payer: Self-pay | Admitting: Family Medicine

## 2023-10-29 DIAGNOSIS — R911 Solitary pulmonary nodule: Secondary | ICD-10-CM

## 2023-11-12 ENCOUNTER — Ambulatory Visit (HOSPITAL_BASED_OUTPATIENT_CLINIC_OR_DEPARTMENT_OTHER)
Admission: RE | Admit: 2023-11-12 | Discharge: 2023-11-12 | Disposition: A | Discharge status: Home / Self Care | Payer: 59 | Source: Ambulatory Visit | Attending: Family Medicine | Admitting: Family Medicine

## 2023-11-12 DIAGNOSIS — R911 Solitary pulmonary nodule: Secondary | ICD-10-CM | POA: Insufficient documentation

## 2023-11-12 DIAGNOSIS — F17210 Nicotine dependence, cigarettes, uncomplicated: Secondary | ICD-10-CM | POA: Diagnosis not present

## 2023-11-16 DIAGNOSIS — Z79899 Other long term (current) drug therapy: Secondary | ICD-10-CM | POA: Diagnosis not present

## 2023-11-16 DIAGNOSIS — M542 Cervicalgia: Secondary | ICD-10-CM | POA: Diagnosis not present

## 2023-11-16 DIAGNOSIS — Z681 Body mass index (BMI) 19 or less, adult: Secondary | ICD-10-CM | POA: Diagnosis not present

## 2023-11-16 DIAGNOSIS — F17200 Nicotine dependence, unspecified, uncomplicated: Secondary | ICD-10-CM | POA: Diagnosis not present

## 2023-11-16 DIAGNOSIS — M47816 Spondylosis without myelopathy or radiculopathy, lumbar region: Secondary | ICD-10-CM | POA: Diagnosis not present

## 2023-11-16 DIAGNOSIS — M961 Postlaminectomy syndrome, not elsewhere classified: Secondary | ICD-10-CM | POA: Diagnosis not present

## 2023-11-16 DIAGNOSIS — Z9181 History of falling: Secondary | ICD-10-CM | POA: Diagnosis not present

## 2023-11-16 DIAGNOSIS — F17210 Nicotine dependence, cigarettes, uncomplicated: Secondary | ICD-10-CM | POA: Diagnosis not present

## 2023-12-07 ENCOUNTER — Emergency Department (HOSPITAL_COMMUNITY)

## 2023-12-07 ENCOUNTER — Other Ambulatory Visit: Payer: Self-pay

## 2023-12-07 ENCOUNTER — Encounter (HOSPITAL_COMMUNITY): Payer: Self-pay

## 2023-12-07 ENCOUNTER — Observation Stay (HOSPITAL_COMMUNITY)
Admission: EM | Admit: 2023-12-07 | Discharge: 2023-12-08 | Disposition: A | Discharge status: Home / Self Care | Attending: Family Medicine | Admitting: Family Medicine

## 2023-12-07 DIAGNOSIS — R918 Other nonspecific abnormal finding of lung field: Secondary | ICD-10-CM | POA: Diagnosis not present

## 2023-12-07 DIAGNOSIS — J208 Acute bronchitis due to other specified organisms: Secondary | ICD-10-CM | POA: Insufficient documentation

## 2023-12-07 DIAGNOSIS — R531 Weakness: Secondary | ICD-10-CM | POA: Diagnosis not present

## 2023-12-07 DIAGNOSIS — E871 Hypo-osmolality and hyponatremia: Secondary | ICD-10-CM | POA: Diagnosis not present

## 2023-12-07 DIAGNOSIS — D75839 Thrombocytosis, unspecified: Secondary | ICD-10-CM | POA: Diagnosis not present

## 2023-12-07 DIAGNOSIS — R0602 Shortness of breath: Secondary | ICD-10-CM | POA: Diagnosis not present

## 2023-12-07 DIAGNOSIS — J441 Chronic obstructive pulmonary disease with (acute) exacerbation: Secondary | ICD-10-CM | POA: Diagnosis not present

## 2023-12-07 DIAGNOSIS — E876 Hypokalemia: Secondary | ICD-10-CM | POA: Insufficient documentation

## 2023-12-07 DIAGNOSIS — R636 Underweight: Secondary | ICD-10-CM | POA: Diagnosis not present

## 2023-12-07 DIAGNOSIS — R630 Anorexia: Secondary | ICD-10-CM | POA: Insufficient documentation

## 2023-12-07 DIAGNOSIS — J449 Chronic obstructive pulmonary disease, unspecified: Secondary | ICD-10-CM | POA: Diagnosis present

## 2023-12-07 DIAGNOSIS — Z8616 Personal history of COVID-19: Secondary | ICD-10-CM | POA: Insufficient documentation

## 2023-12-07 DIAGNOSIS — J45901 Unspecified asthma with (acute) exacerbation: Secondary | ICD-10-CM | POA: Insufficient documentation

## 2023-12-07 DIAGNOSIS — Z681 Body mass index (BMI) 19 or less, adult: Secondary | ICD-10-CM | POA: Diagnosis not present

## 2023-12-07 DIAGNOSIS — Z7982 Long term (current) use of aspirin: Secondary | ICD-10-CM | POA: Insufficient documentation

## 2023-12-07 DIAGNOSIS — F17210 Nicotine dependence, cigarettes, uncomplicated: Secondary | ICD-10-CM | POA: Diagnosis not present

## 2023-12-07 DIAGNOSIS — Z743 Need for continuous supervision: Secondary | ICD-10-CM | POA: Diagnosis not present

## 2023-12-07 DIAGNOSIS — R110 Nausea: Secondary | ICD-10-CM | POA: Diagnosis not present

## 2023-12-07 DIAGNOSIS — I10 Essential (primary) hypertension: Secondary | ICD-10-CM | POA: Diagnosis not present

## 2023-12-07 DIAGNOSIS — F419 Anxiety disorder, unspecified: Secondary | ICD-10-CM | POA: Insufficient documentation

## 2023-12-07 DIAGNOSIS — R0902 Hypoxemia: Secondary | ICD-10-CM | POA: Diagnosis not present

## 2023-12-07 LAB — RESPIRATORY PANEL BY PCR

## 2023-12-07 LAB — COMPREHENSIVE METABOLIC PANEL
ALT: 26 U/L (ref 0–44)
AST: 25 U/L (ref 15–41)
Albumin: 3.1 g/dL — ABNORMAL LOW (ref 3.5–5.0)
Alkaline Phosphatase: 135 U/L — ABNORMAL HIGH (ref 38–126)
Anion gap: 15 (ref 5–15)
BUN: 29 mg/dL — ABNORMAL HIGH (ref 8–23)
CO2: 26 mmol/L (ref 22–32)
Calcium: 8.9 mg/dL (ref 8.9–10.3)
Chloride: 91 mmol/L — ABNORMAL LOW (ref 98–111)
Creatinine, Ser: 0.8 mg/dL (ref 0.44–1.00)
GFR, Estimated: >60 mL/min (ref >60)
Glucose, Bld: 149 mg/dL — ABNORMAL HIGH (ref 70–99)
Potassium: 3 mmol/L — ABNORMAL LOW (ref 3.5–5.1)
Sodium: 132 mmol/L — ABNORMAL LOW (ref 135–145)
Total Bilirubin: 1.2 mg/dL (ref 0.0–1.2)
Total Protein: 7.4 g/dL (ref 6.5–8.1)

## 2023-12-07 LAB — CBC
HCT: 49.8 % — ABNORMAL HIGH (ref 36.0–46.0)
HCT: 46 % (ref 36.0–46.0)
Hemoglobin: 16.9 g/dL — ABNORMAL HIGH (ref 12.0–15.0)
Hemoglobin: 15.4 g/dL — ABNORMAL HIGH (ref 12.0–15.0)
MCH: 35.7 pg — ABNORMAL HIGH (ref 26.0–34.0)
MCH: 35.6 pg — ABNORMAL HIGH (ref 26.0–34.0)
MCHC: 33.9 g/dL (ref 30.0–36.0)
MCHC: 33.5 g/dL (ref 30.0–36.0)
MCV: 105.1 fL — ABNORMAL HIGH (ref 80.0–100.0)
MCV: 106.5 fL — ABNORMAL HIGH (ref 80.0–100.0)
Platelets: 504 10*3/uL — ABNORMAL HIGH (ref 150–400)
Platelets: 456 10*3/uL — ABNORMAL HIGH (ref 150–400)
RBC: 4.74 MIL/uL (ref 3.87–5.11)
RBC: 4.32 MIL/uL (ref 3.87–5.11)
RDW: 12.7 % (ref 11.5–15.5)
RDW: 12.8 % (ref 11.5–15.5)
WBC: 10.8 10*3/uL — ABNORMAL HIGH (ref 4.0–10.5)
WBC: 14.7 10*3/uL — ABNORMAL HIGH (ref 4.0–10.5)
nRBC: 0 % (ref 0.0–0.2)
nRBC: 0 % (ref 0.0–0.2)

## 2023-12-07 LAB — FOLATE: Folate: 11.3 ng/mL (ref >5.9)

## 2023-12-07 LAB — RESP PANEL BY RT-PCR (RSV, FLU A&B, COVID)  RVPGX2
Influenza A by PCR: NEGATIVE
Influenza B by PCR: NEGATIVE
Resp Syncytial Virus by PCR: NEGATIVE
SARS Coronavirus 2 by RT PCR: NEGATIVE

## 2023-12-07 LAB — CREATININE, SERUM
Creatinine, Ser: 0.51 mg/dL (ref 0.44–1.00)
GFR, Estimated: >60 mL/min (ref >60)

## 2023-12-07 LAB — VITAMIN B12: Vitamin B-12: 362 pg/mL (ref 180–914)

## 2023-12-07 LAB — HIV ANTIBODY (ROUTINE TESTING W REFLEX): HIV Screen 4th Generation wRfx: NONREACTIVE

## 2023-12-07 MED ORDER — LOSARTAN POTASSIUM 50 MG PO TABS: 50.0000 mg | ORAL_TABLET | Freq: Every morning | ORAL | Status: DC

## 2023-12-07 MED ORDER — ALBUTEROL SULFATE (2.5 MG/3ML) 0.083% IN NEBU: 2.5000 mg | INHALATION_SOLUTION | RESPIRATORY_TRACT | Status: DC | PRN

## 2023-12-07 MED ORDER — METHYLPREDNISOLONE SODIUM SUCC 40 MG IJ SOLR: 40.0000 mg | Freq: Two times a day (BID) | INTRAMUSCULAR | Status: DC

## 2023-12-07 MED ORDER — METHYLPREDNISOLONE SODIUM SUCC 125 MG IJ SOLR
125.0000 mg | Freq: Once | INTRAMUSCULAR | Status: AC
  Administered 2023-12-07: 125 mg via INTRAVENOUS
  Filled 2023-12-07: qty 2

## 2023-12-07 MED ORDER — BUDESONIDE 0.25 MG/2ML IN SUSP
0.2500 mg | Freq: Two times a day (BID) | RESPIRATORY_TRACT | Status: DC
  Administered 2023-12-07 – 2023-12-08 (×2): 0.25 mg via RESPIRATORY_TRACT
  Filled 2023-12-07 (×2): qty 2

## 2023-12-07 MED ORDER — ACETAMINOPHEN 650 MG RE SUPP: 650.0000 mg | Freq: Four times a day (QID) | RECTAL | Status: DC | PRN

## 2023-12-07 MED ORDER — POTASSIUM CHLORIDE 10 MEQ/100ML IV SOLN
10.0000 meq | Freq: Once | INTRAVENOUS | Status: AC
  Administered 2023-12-07: 10 meq via INTRAVENOUS
  Filled 2023-12-07: qty 100

## 2023-12-07 MED ORDER — TRAZODONE HCL 100 MG PO TABS
100.0000 mg | ORAL_TABLET | Freq: Every day | ORAL | Status: DC
  Administered 2023-12-07: 100 mg via ORAL
  Filled 2023-12-07: qty 1

## 2023-12-07 MED ORDER — SODIUM CHLORIDE 0.9% FLUSH
3.0000 mL | Freq: Two times a day (BID) | INTRAVENOUS | Status: DC
  Administered 2023-12-07 – 2023-12-08 (×2): 3 mL via INTRAVENOUS

## 2023-12-07 MED ORDER — GUAIFENESIN ER 600 MG PO TB12
600.0000 mg | ORAL_TABLET | Freq: Two times a day (BID) | ORAL | Status: DC
  Administered 2023-12-07 – 2023-12-08 (×2): 600 mg via ORAL
  Filled 2023-12-07 (×2): qty 1

## 2023-12-07 MED ORDER — IPRATROPIUM-ALBUTEROL 0.5-2.5 (3) MG/3ML IN SOLN
3.0000 mL | Freq: Four times a day (QID) | RESPIRATORY_TRACT | Status: DC
  Administered 2023-12-07 – 2023-12-08 (×2): 3 mL via RESPIRATORY_TRACT
  Filled 2023-12-07 (×2): qty 3

## 2023-12-07 MED ORDER — PREDNISONE 50 MG PO TABS
50.0000 mg | ORAL_TABLET | Freq: Every day | ORAL | Status: DC
  Administered 2023-12-08: 50 mg via ORAL
  Filled 2023-12-07: qty 1

## 2023-12-07 MED ORDER — ONDANSETRON HCL 4 MG PO TABS
4.0000 mg | ORAL_TABLET | Freq: Four times a day (QID) | ORAL | Status: DC | PRN
Start: 2023-12-07 — End: 2023-12-08

## 2023-12-07 MED ORDER — SODIUM CHLORIDE 0.9 % IV SOLN
500.0000 mg | INTRAVENOUS | Status: DC
  Administered 2023-12-07: 500 mg via INTRAVENOUS
  Filled 2023-12-07 (×2): qty 5

## 2023-12-07 MED ORDER — METOPROLOL SUCCINATE ER 50 MG PO TB24: 100.0000 mg | ORAL_TABLET | Freq: Every morning | ORAL | Status: DC

## 2023-12-07 MED ORDER — ONDANSETRON HCL 4 MG/2ML IJ SOLN
4.0000 mg | Freq: Four times a day (QID) | INTRAMUSCULAR | Status: DC | PRN
  Administered 2023-12-07: 4 mg via INTRAVENOUS
  Filled 2023-12-07: qty 2

## 2023-12-07 MED ORDER — LOSARTAN POTASSIUM 25 MG PO TABS
25.0000 mg | ORAL_TABLET | Freq: Every day | ORAL | Status: DC
  Administered 2023-12-08: 25 mg via ORAL
  Filled 2023-12-07: qty 1

## 2023-12-07 MED ORDER — IPRATROPIUM-ALBUTEROL 0.5-2.5 (3) MG/3ML IN SOLN
9.0000 mL | Freq: Once | RESPIRATORY_TRACT | Status: AC
  Administered 2023-12-07: 9 mL via RESPIRATORY_TRACT
  Filled 2023-12-07: qty 3

## 2023-12-07 MED ORDER — ENOXAPARIN SODIUM 40 MG/0.4ML IJ SOSY: 40.0000 mg | PREFILLED_SYRINGE | INTRAMUSCULAR | Status: DC

## 2023-12-07 MED ORDER — SERTRALINE HCL 50 MG PO TABS
50.0000 mg | ORAL_TABLET | Freq: Every day | ORAL | Status: DC
  Administered 2023-12-07 – 2023-12-08 (×2): 50 mg via ORAL
  Filled 2023-12-07 (×2): qty 1

## 2023-12-07 MED ORDER — ACETAMINOPHEN 325 MG PO TABS: 650.0000 mg | ORAL_TABLET | Freq: Four times a day (QID) | ORAL | Status: DC | PRN

## 2023-12-07 MED ORDER — MAGNESIUM SULFATE 2 GM/50ML IV SOLN
2.0000 g | Freq: Once | INTRAVENOUS | Status: AC
Start: 2023-12-07 — End: 2023-12-07
  Administered 2023-12-07: 2 g via INTRAVENOUS
  Filled 2023-12-07: qty 50

## 2023-12-07 MED ORDER — METOPROLOL SUCCINATE ER 50 MG PO TB24
50.0000 mg | ORAL_TABLET | Freq: Every day | ORAL | Status: DC
  Administered 2023-12-08: 50 mg via ORAL
  Filled 2023-12-07: qty 1

## 2023-12-07 MED ORDER — POTASSIUM CHLORIDE 20 MEQ PO PACK
40.0000 meq | PACK | Freq: Once | ORAL | Status: AC
  Administered 2023-12-07: 40 meq via ORAL
  Filled 2023-12-07: qty 2

## 2023-12-07 MED ORDER — POTASSIUM CHLORIDE CRYS ER 20 MEQ PO TBCR: 40.0000 meq | EXTENDED_RELEASE_TABLET | Freq: Once | ORAL | Status: DC

## 2023-12-07 MED ORDER — ENOXAPARIN SODIUM 30 MG/0.3ML IJ SOSY
30.0000 mg | PREFILLED_SYRINGE | INTRAMUSCULAR | Status: DC
  Administered 2023-12-07: 30 mg via SUBCUTANEOUS
  Filled 2023-12-07: qty 0.3

## 2023-12-07 NOTE — H&P (Addendum)
 History and Physical    Patient: Renee Oliver:[id number] DOB: [birth year] DOA: 12/07/2023 DOS: the patient was seen and examined on 12/07/2023 PCP: Stamey, Verda Cumins, FNP  Patient coming from: Home  Medical readiness/disposition: Anticipate patient may be ready for discharge as soon as 12/08/2023 and she will discharge back to home.  Chief Complaint:  Chief Complaint  Patient presents with   Weakness   HPI: Renee Oliver is a 68 y.o. female with medical history significant of hypertension, COPD and SVT.  Patient presented to the ED after experiencing 12 days of upper respiratory symptoms, nausea anorexia, aches, fatigue and overall malaise.  She has been coughing up green sputum.  She spoke with her PCP who recommended she present to the ED for evaluation.  Of note she uses Symbicort but does not have a rescue inhaler.  She is a 45-pack-year smoker.  In addition to the above patient reports that her son was flu positive and is living in the home with her.  Her symptoms began after he developed his flu symptoms.  Upon presentation to the ED she was hypoxemic with O2 sats 85% on room air and she has been placed on 2 L nasal cannula O2 with improvement in sats.  History as above.  She was hemodynamically stable.  Labs revealed mild hyponatremia and hypokalemia.  BUN was elevated at 29.  Hemoglobin 16.9 with an MCV of 105.1 and platelets 504,000.  PCR for influenza, RSV and COVID are negative.  Chest x-ray consistent with COPD with chronic interstitial coarsening.  There is also an incidental finding of a nodular density of the right base likely reflecting a nipple shadow but repeat chest x-ray recommended.  Hospital service asked to evaluate the patient for admission.  Review of Systems: As mentioned in the history of present illness. All other systems reviewed and are negative. Past Medical History:  Diagnosis Date   Anxiety    Asthma    Chronic back pain    COPD (chronic obstructive pulmonary  disease) (HCC)    COVID 07/2021   DDD (degenerative disc disease), lumbar    Depression    Hypertension    Pneumonia    septic pneumonia october 2022   Past Surgical History:  Procedure Laterality Date   ANTERIOR CERVICAL DECOMP/DISCECTOMY FUSION N/A 02/14/2022   Procedure: Anterior Cervical Decompression Fusion  - C3-C4;  Surgeon: Julio Sicks, MD;  Location: [facility] OR;  Service: Neurosurgery;  Laterality: N/A;   BACK SURGERY     BILATERAL SALPINGECTOMY     CHOLECYSTECTOMY     DIAGNOSTIC LAPAROSCOPY     DILATATION & CURETTAGE/HYSTEROSCOPY WITH MYOSURE N/A 11/29/2016   Procedure: DILATATION & CURETTAGE/HYSTEROSCOPY;  Surgeon: Geryl Rankins, MD;  Location: [facility];  Service: Gynecology;  Laterality: N/A;  Ultrasound Guidance   PARTIAL HYSTERECTOMY     RIGHT OOPHORECTOMY     TONSILLECTOMY     Social History:  reports that she has been smoking cigarettes. She has a 42 pack-year smoking history. She has never used smokeless tobacco. She reports current alcohol use. She reports that she does not use drugs.  Allergies  Allergen Reactions   Amoxicillin Nausea And Vomiting    Can take penicillin but Amoxicillin makes her have upset stomach   Aspirin Nausea And Vomiting and Other (See Comments)    Heart palpations    Celexa [Citalopram] Nausea Only and Other (See Comments)    heartburn   Doxycycline Diarrhea and Nausea Only   Flexeril [Cyclobenzaprine] Other (See  Comments)    "Makes me feel very strange"/upset stomach   Lyrica [Pregabalin] Nausea Only and Other (See Comments)    "Makes me feel strange/weird"   Magnesium Diarrhea and Nausea Only   Neurontin [Gabapentin] Nausea Only and Other (See Comments)    "Makes me feel very strange/weird"   Nsaids Nausea And Vomiting   Prednisone Other (See Comments)    Hyper  Mental changes   Iodinated Contrast Media Swelling and Rash    Throat swelling   Tramadol Nausea Only and Anxiety    Insomnia/anorexia/mood changes/hyperactive (injection  version per patient)    History reviewed. No pertinent family history.  Prior to Admission medications   Medication Sig Start Date End Date Taking? Authorizing Provider  acetaminophen (TYLENOL) 325 MG tablet Take 325-650 mg by mouth every 6 (six) hours as needed (for pain.).    [provider]  albuterol (VENTOLIN HFA) 108 (90 Base) MCG/ACT inhaler Inhale 2 puffs into the lungs every 6 (six) hours as needed for wheezing or shortness of breath. 07/24/21   Sherryll Burger, Pratik D, DO  hydrochlorothiazide (HYDRODIURIL) 12.5 MG tablet Take 12.5 mg by mouth in the morning. 01/10/22   [provider]  loperamide (IMODIUM) 2 MG capsule Take 2 mg by mouth 4 (four) times daily as needed for diarrhea or loose stools.    [provider]  losartan (COZAAR) 50 MG tablet Take 50 mg by mouth in the morning. 01/10/22   [provider]  metoprolol succinate (TOPROL-XL) 100 MG 24 hr tablet Take 100 mg by mouth in the morning. 01/10/22   [provider]  oxyCODONE-acetaminophen (PERCOCET) 10-325 MG tablet Take 1 tablet by mouth 5 (five) times daily.    [provider]  pramipexole (MIRAPEX) 0.25 MG tablet Take 0.25 mg by mouth at bedtime.    [provider]  SYMBICORT 160-4.5 MCG/ACT inhaler Inhale 2 puffs into the lungs 2 (two) times daily.    [provider]  traZODone (DESYREL) 100 MG tablet Take 100 mg by mouth at bedtime. 01/10/22   [provider]    Physical Exam: Vitals:   12/07/23 1344 12/07/23 1400 12/07/23 1415 12/07/23 1453  BP:  107/66    Pulse:  65 65   Resp:  13 16   Temp:    97.9 F (36.6 C)  TempSrc:    Oral  SpO2: (!) 88% 98% 99%   Weight:      Height:       Constitutional: NAD, calm, comfortable but appears chronically ill and cachectic Respiratory: Bilateral lung sounds with coarse expiratory wheezing but otherwise good airway movement.  Patient has barrel chest, 2 L oxygen.  No increased work of breathing at rest.   Able to speak without becoming dyspneic. Cardiovascular: Regular rate and rhythm, no murmurs / rubs / gallops. No extremity edema. 2+ pedal pulses Abdomen: no tenderness, no masses palpated. No hepatosplenomegaly. Bowel sounds positive.  Musculoskeletal: no clubbing / cyanosis. No joint deformity upper and lower extremities. Good ROM, no contractures. Normal muscle tone.  Skin: no rashes, lesions, ulcers. No induration-skin appears somewhat dry and patient appears to be dehydrated Neurologic: CN 2-12 grossly intact. Sensation intact, DTR normal. Strength 5/5 x all 4 extremities.  Psychiatric: Normal judgment and insight. Alert and oriented x 3. Normal mood.    Data Reviewed:  Sodium 132, potassium 3.0, chloride 91, glucose 149, BUN 29, creatinine 0.8, alkaline phosphatase 135, albumin 3.1, LFTs otherwise normal  WBC 10,800 differential not obtained, hemoglobin 16.9,  MCV 105.1, platelets 504,000  Assessment and Plan: Acute COPD exacerbation with hypoxia Continue O2 and wean as tolerated Supportive care with scheduled DuoNebs and budesonide nebs, as needed albuterol nebs for breakthrough symptoms Continue steroids with prednisone Patient will need rescue inhaler upon discharge Mucinex Flutter  Postviral bronchitis Patient reports productive cough with green sputum Initiate Zithromax 500 IV for total duration of 5 days She was negative for COVID, influenza and RSV.  For completeness of evaluation we will obtain a 20 pathogen respiratory viral panel  Nodular density on chest x-ray Radiologist recommends follow-up chest x-ray  Acute hyponatremia and hypokalemia Given history of prior SVT need to maintain potassium greater than 4.0 and magnesium greater than 2.0 Suspect hyponatremia secondary to volume depletion-Ensure adequate oral intake of fluids Replace as needed-did receive 10 mEq x 2 potassium in the ED.  Will give oral potassium 40 mill equivalents x 1 Repeat labs in the  a.m.  Thrombocytopenia with elevated platelets and MCV Likely combination of COPD and possibly volume depletion Hemoglobin appears to be at baseline as is MCV-check B12 and folate levels Platelets are slightly elevated and likely reflective of volume depletion Encourage oral intake of fluids  Hypertension On HydroDIURIL and Cozaar at home Given concerns over possible acute volume depletion we will hold HydroDIURIL at this time but will continue Cozaar-blood pressure somewhat suboptimal so we will decrease Cozaar to 25 mg and Toprol XL to 50 mg  Ongoing tobacco abuse Counseled regarding cessation  Nonspecified anxiety disorder Continue Zoloft and trazodone     Advance Care Planning:   Code Status: Full Code   VTE prophylaxis: Lovenox  Consults: None  Family Communication: Patient only  Severity of Illness: The appropriate patient status for this patient is OBSERVATION. Observation status is judged to be reasonable and necessary in order to provide the required intensity of service to ensure the patient's safety. The patient's presenting symptoms, physical exam findings, and initial radiographic and laboratory data in the context of their medical condition is felt to place them at decreased risk for further clinical deterioration. Furthermore, it is anticipated that the patient will be medically stable for discharge from the hospital within 2 midnights of admission.   Author: Junious Silk, NP 12/07/2023 2:55 PM  For on call review [email or web address].      Attending MD Note:  I have seen and examined the patient with NP and agree with the note above which has been edited to reflect our agreed upon history, exam, and assessment/plan.    I have personally reviewed the orders for the patient, which were made under my direction.         Mercades Bajaj P Carolee Channell

## 2023-12-07 NOTE — ED Provider Notes (Signed)
 [place] EMERGENCY DEPARTMENT AT [facility] Provider Note   CSN: [id number] Arrival date & time: 12/07/23  4098     History Chief Complaint  Patient presents with   Weakness    HPI Renee Oliver is a 68 y.o. female presenting for shortness of breath and weakness. 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  Does not use oxygen at baseline.  Endorses worsening shortness of breath over the past 72 hours.  Recent sick family members.  Much weaker than her baseline.   Patient's recorded medical, surgical, social, medication list and allergies were reviewed in the Snapshot window as part of the initial history.   Review of Systems   Review of Systems  Constitutional:  Negative for chills and fever.  HENT:  Negative for ear pain and sore throat.   Eyes:  Negative for pain and visual disturbance.  Respiratory:  Positive for chest tightness, shortness of breath and wheezing. Negative for cough.   Cardiovascular:  Negative for chest pain and palpitations.  Gastrointestinal:  Negative for abdominal pain and vomiting.  Genitourinary:  Negative for dysuria and hematuria.  Musculoskeletal:  Negative for arthralgias and back pain.  Skin:  Negative for color change and rash.  Neurological:  Negative for seizures and syncope.  All other systems reviewed and are negative.   Physical Exam Updated Vital Signs BP 107/66   Pulse 65   Temp 97.9 F (36.6 C) (Oral)   Resp 16   Ht 5\' 3"  (1.6 m)   Wt 43.1 kg   SpO2 99%   BMI 16.83 kg/m  Physical Exam Vitals and nursing note reviewed.  Constitutional:      General: She is not in acute distress.    Appearance: She is well-developed.  HENT:     Head: Normocephalic and atraumatic.  Eyes:     Conjunctiva/sclera: Conjunctivae normal.  Cardiovascular:     Rate and Rhythm: Normal rate and regular rhythm.     Heart sounds: No murmur heard. Pulmonary:     Effort: Respiratory  distress present.     Breath sounds: Wheezing and rhonchi present.  Abdominal:     General: There is no distension.     Palpations: Abdomen is soft.     Tenderness: There is no abdominal tenderness. There is no right CVA tenderness or left CVA tenderness.  Musculoskeletal:        General: No swelling or tenderness. Normal range of motion.     Cervical back: Neck supple.  Skin:    General: Skin is warm and dry.  Neurological:     General: No focal deficit present.     Mental Status: She is alert and oriented to person, place, and time. Mental status is at baseline.     Cranial Nerves: No cranial nerve deficit.      ED Course/ Medical Decision Making/ A&P    Procedures Procedures   Medications Ordered in ED Medications  potassium chloride SA (KLOR-CON M) CR tablet 40 mEq (has no administration in time range)  azithromycin (ZITHROMAX) 500 mg in sodium chloride 0.9 % 250 mL IVPB (has no administration in time range)  ipratropium-albuterol (DUONEB) 0.5-2.5 (3) MG/3ML nebulizer solution 3 mL (has no administration in time range)  budesonide (PULMICORT) nebulizer solution 0.25 mg (has no administration in time range)  albuterol (PROVENTIL) (2.5 MG/3ML) 0.083% nebulizer solution 2.5 mg (has no administration in time range)  sodium chloride flush (NS) 0.9 % injection  3 mL (has no administration in time range)  acetaminophen (TYLENOL) tablet 650 mg (has no administration in time range)    Or  acetaminophen (TYLENOL) suppository 650 mg (has no administration in time range)  ondansetron (ZOFRAN) tablet 4 mg (has no administration in time range)    Or  ondansetron (ZOFRAN) injection 4 mg (has no administration in time range)  methylPREDNISolone sodium succinate (SOLU-MEDROL) 40 mg/mL injection 40 mg (has no administration in time range)  enoxaparin (LOVENOX) injection 30 mg (has no administration in time range)  sertraline (ZOLOFT) tablet 50 mg (has no administration in time range)   traZODone (DESYREL) tablet 100 mg (has no administration in time range)  metoprolol succinate (TOPROL-XL) 24 hr tablet 50 mg (has no administration in time range)  losartan (COZAAR) tablet 25 mg (has no administration in time range)  ipratropium-albuterol (DUONEB) 0.5-2.5 (3) MG/3ML nebulizer solution 9 mL (9 mLs Nebulization Given 12/07/23 1344)  methylPREDNISolone sodium succinate (SOLU-MEDROL) 125 mg/2 mL injection 125 mg (125 mg Intravenous Given 12/07/23 1309)  magnesium sulfate IVPB 2 g 50 mL (2 g Intravenous New Bag/Given 12/07/23 1311)  potassium chloride 10 mEq in 100 mL IVPB (10 mEq Intravenous New Bag/Given 12/07/23 1349)  potassium chloride (KLOR-CON) packet 40 mEq (40 mEq Oral Given 12/07/23 1339)   Medical Decision Making:   Renee Oliver is a 69 y.o. female with a history of COPD, who presented to the ED today with acute on chronic SOB. They are endorsing worsening of their baseline dyspnea over the past 72 hours. Their baseline is a 0L O2 requirement. At their baseline they are able to get around the neighborhood and they are not able to at this time.   On my initial exam, the pt was SOB and tachypneic. Audible wheezing and grossly decreased breath sounds appreciated.  They are endorsing increased sputum production.    Reviewed and confirmed nursing documentation for past medical history, family history, social history.    Initial Assessment:   With the patient's presentation of SOB in the above setting, most likely diagnosis is COPD Exacerbation. Other diagnoses were considered including (but not limited to) CAP, PE, ACS, viral infection, PTX. These are considered less likely due to history of present illness and physical exam findings.   This is most consistent with an acute life/limb threatening illness complicated by underlying chronic conditions.  Initial Plan:  Empiric treatment of patient's symptoms with immediate initiation of inhaled bronchodilators and IV steroids. Given  advanced nature of patient's presentation, will proceed with IV magnesium as a rescue therapy.   Evaluation for ACS with EKG and delta troponin  Evaluation for infectious versus intrathoracic abnormality with chest x-ray  Evaluation for volume overload with BNP  Screening labs including CBC and Metabolic panel to evaluate for infectious or metabolic etiology of disease.  Patient's Wells score is LOW and patient does not warrant further objective evaluation for PE based on consistency of presentation of alternative diagnosis.  Objective evaluation as below reviewed   Initial Study Results:   Laboratory  All laboratory results reviewed without evidence of clinically relevant pathology.    EKG EKG was reviewed independently. Rate, rhythm, axis, intervals all examined and without medically relevant abnormality. ST segments without concerns for elevations.    Radiology:  All images reviewed independently. Agree with radiology report at this time.   DG Chest Port 1 View Result Date: 12/07/2023 CLINICAL DATA:  Shortness of breath. EXAM: PORTABLE CHEST 1 VIEW COMPARISON:  07/19/2021 FINDINGS: Hyperexpansion. Interstitial  markings are diffusely coarsened with chronic features. No focal airspace consolidation, pulmonary edema, or substantial pleural effusion. Nodular density over the right base may reflect a nipple shadow. Multiple buttons project over the left lung. The cardiopericardial silhouette is within normal limits for size. No acute bony abnormality. Telemetry leads overlie the chest. IMPRESSION: 1. Hyperexpansion with chronic interstitial coarsening. No acute cardiopulmonary findings. 2. Nodular density over the right base may reflect a nipple shadow. Repeat PA and lateral chest x-ray with nipple markers recommended to confirm. Electronically Signed   By: Kennith Center M.D.   On: 12/07/2023 11:59   CT CHEST LCS NODULE F/U LOW DOSE WO CONTRAST Result Date: 11/26/2023 CLINICAL DATA:  68 year old  female smoker presents for short-term follow-up EXAM: CT CHEST WITHOUT CONTRAST FOR LUNG CANCER SCREENING NODULE FOLLOW-UP TECHNIQUE: Multidetector CT imaging of the chest was performed following the standard protocol without IV contrast. RADIATION DOSE REDUCTION: This exam was performed according to the departmental dose-optimization program which includes automated exposure control, adjustment of the mA and/or kV according to patient size and/or use of iterative reconstruction technique. COMPARISON:  07/25/2023 screening chest CT FINDINGS: Cardiovascular: Normal heart size. No significant pericardial effusion/thickening. Atherosclerotic nonaneurysmal thoracic aorta. Normal caliber pulmonary arteries. Mediastinum/Nodes: No significant thyroid nodules. Unremarkable esophagus. No pathologically enlarged axillary, mediastinal or hilar lymph nodes, noting limited sensitivity for the detection of hilar adenopathy on this noncontrast study. Calcified subcentimeter left hilar nodes are unchanged compatible with prior granulomatous disease. Lungs/Pleura: No pneumothorax. No pleural effusion. Moderate centrilobular emphysema with diffuse bronchial wall thickening. No acute consolidative airspace disease or lung masses. Patchy regions of mild cylindrical bronchiectasis, mucous impaction and tree-in-bud opacities throughout both lungs, most prominent in the inferior lingula and right middle lobe and posterior left lower lobe, similar. Previously described bilateral pulmonary nodules up to 7.9 mm in the subpleural posteromedial right upper lobe on series 302/image 46, all stable or decreased in size. No new significant pulmonary nodules. Upper abdomen: No acute abnormality. Musculoskeletal: No aggressive appearing focal osseous lesions. Mild thoracic spondylosis. IMPRESSION: 1. Lung-RADS 2, benign appearance or behavior. Continue annual screening with low-dose chest CT without contrast in 12 months. 2. 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 3. Aortic Atherosclerosis (ICD10-I70.0) and Emphysema (ICD10-J43.9). Electronically Signed   By: Delbert Phenix M.D.   On: 11/26/2023 11:58   Final Assessment and Plan:   After initiation of medical therapies, patient is grossly improved and no longer in acute distress.    Given the advanced nature of the patient's prenetation, patient will require advanced care and admission was arranged.      Clinical Impression:  1. Generalized weakness      Admit   Final Clinical Impression(s) / ED Diagnoses Final diagnoses:  Generalized weakness    Rx / DC Orders ED Discharge Orders     None         Glyn Ade, MD 12/07/23 1510

## 2023-12-07 NOTE — ED Triage Notes (Addendum)
 Patient arrived via [facility] for weakness/nausea for 10 days. PCP recommended patient come to ER to be seen. Pt has history of COPD, does not wear O2 at home, on arrival pt O2 sats 85% placed on nasal cannula in triage. Denies shortness of breath. Pt states she was recently around her nephew who she thinks had the Flu.

## 2023-12-08 ENCOUNTER — Other Ambulatory Visit (HOSPITAL_COMMUNITY): Payer: Self-pay

## 2023-12-08 DIAGNOSIS — J441 Chronic obstructive pulmonary disease with (acute) exacerbation: Secondary | ICD-10-CM | POA: Diagnosis not present

## 2023-12-08 LAB — BASIC METABOLIC PANEL
Anion gap: 10 (ref 5–15)
BUN: 18 mg/dL (ref 8–23)
CO2: 27 mmol/L (ref 22–32)
Calcium: 8.6 mg/dL — ABNORMAL LOW (ref 8.9–10.3)
Chloride: 97 mmol/L — ABNORMAL LOW (ref 98–111)
Creatinine, Ser: 0.51 mg/dL (ref 0.44–1.00)
GFR, Estimated: >60 mL/min (ref >60)
Glucose, Bld: 110 mg/dL — ABNORMAL HIGH (ref 70–99)
Potassium: 3.4 mmol/L — ABNORMAL LOW (ref 3.5–5.1)
Sodium: 134 mmol/L — ABNORMAL LOW (ref 135–145)

## 2023-12-08 LAB — CBC
HCT: 41.1 % (ref 36.0–46.0)
Hemoglobin: 13.9 g/dL (ref 12.0–15.0)
MCH: 35.5 pg — ABNORMAL HIGH (ref 26.0–34.0)
MCHC: 33.8 g/dL (ref 30.0–36.0)
MCV: 105.1 fL — ABNORMAL HIGH (ref 80.0–100.0)
Platelets: 409 10*3/uL — ABNORMAL HIGH (ref 150–400)
RBC: 3.91 MIL/uL (ref 3.87–5.11)
RDW: 12.9 % (ref 11.5–15.5)
WBC: 7.8 10*3/uL (ref 4.0–10.5)
nRBC: 0 % (ref 0.0–0.2)

## 2023-12-08 MED ORDER — AZITHROMYCIN 250 MG PO TABS
250.0000 mg | ORAL_TABLET | Freq: Every day | ORAL | 0 refills | Status: AC
  Filled 2023-12-08: qty 3, 3d supply, fill #0

## 2023-12-08 MED ORDER — IPRATROPIUM-ALBUTEROL 0.5-2.5 (3) MG/3ML IN SOLN: 3.0000 mL | Freq: Two times a day (BID) | RESPIRATORY_TRACT | Status: DC

## 2023-12-08 MED ORDER — GUAIFENESIN ER 600 MG PO TB12
600.0000 mg | ORAL_TABLET | Freq: Two times a day (BID) | ORAL | Status: AC
Start: 2023-12-08 — End: ?

## 2023-12-08 MED ORDER — PREDNISONE 50 MG PO TABS
50.0000 mg | ORAL_TABLET | Freq: Every day | ORAL | 0 refills | Status: AC
  Filled 2023-12-08: qty 3, 3d supply, fill #0

## 2023-12-08 MED ORDER — ALBUTEROL SULFATE HFA 108 (90 BASE) MCG/ACT IN AERS
2.0000 | INHALATION_SPRAY | Freq: Four times a day (QID) | RESPIRATORY_TRACT | 0 refills | Status: AC | PRN
  Filled 2023-12-08: qty 6.7, 25d supply, fill #0

## 2023-12-08 MED ORDER — OXYCODONE HCL 5 MG PO TABS: 5.0000 mg | ORAL_TABLET | ORAL | Status: DC | PRN

## 2023-12-08 MED ORDER — OXYCODONE-ACETAMINOPHEN 5-325 MG PO TABS
1.0000 | ORAL_TABLET | ORAL | Status: DC | PRN
  Administered 2023-12-08: 1 via ORAL
  Filled 2023-12-08: qty 1

## 2023-12-08 MED ORDER — SYMBICORT 160-4.5 MCG/ACT IN AERO
2.0000 | INHALATION_SPRAY | Freq: Two times a day (BID) | RESPIRATORY_TRACT | 0 refills | Status: AC
  Filled 2023-12-08 (×2): qty 10.2, 30d supply, fill #0

## 2023-12-08 MED ORDER — ONDANSETRON HCL 4 MG PO TABS
4.0000 mg | ORAL_TABLET | Freq: Four times a day (QID) | ORAL | 0 refills | Status: AC | PRN
  Filled 2023-12-08: qty 30, 8d supply, fill #0

## 2023-12-08 NOTE — Care Management Obs Status (Signed)
 MEDICARE OBSERVATION STATUS NOTIFICATION   Patient Details  Name: SAMAMTHA TIEGS MRN: [id number] Date of Birth: 11-01-1955   Medicare Observation Status Notification Given:  Yes    Diona Browner, LCSW 12/08/2023, 11:40 AM

## 2023-12-08 NOTE — Plan of Care (Addendum)
 VSS. NSR/SB on tele. No c/o pain. No acute events overnight.  Problem: Education: Goal: Knowledge of General Education information will improve Description: Including pain rating scale, medication(s)/side effects and non-pharmacologic comfort measures Outcome: Progressing   Problem: Clinical Measurements: Goal: Ability to maintain clinical measurements within normal limits will improve Outcome: Progressing Goal: Respiratory complications will improve Outcome: Progressing   Problem: Activity: Goal: Risk for activity intolerance will decrease Outcome: Progressing   Problem: Safety: Goal: Ability to remain free from injury will improve Outcome: Progressing

## 2023-12-08 NOTE — Plan of Care (Signed)

## 2023-12-08 NOTE — Progress Notes (Signed)
 SATURATION QUALIFICATIONS: (This note is used to comply with regulatory documentation for home oxygen)  Patient Saturations on Room Air at Rest = 90%  Patient Saturations on Room Air while Ambulating = 79%  Patient Saturations on 2 Liters of oxygen while Ambulating = 93%  Please briefly explain why patient needs home oxygen:

## 2023-12-08 NOTE — TOC Transition Note (Signed)
 Transition of Care [facility]) - Discharge Note   Patient Details  Name: Renee Oliver MRN: [id number] Date of Birth: 1955/11/15  Transition of [facility]) CM/SW Contact:  Diona Browner, LCSW Phone Number: 12/08/2023, 4:07 PM   Clinical Narrative:    Pt medically ready to d/c home. Pt has new O2 requirement. O2 ordered through [employer]. No additional TOC needs.   Final next level of care: Home/Self Care Barriers to Discharge: No Barriers Identified   Patient Goals and CMS Choice Patient states their goals for this hospitalization and ongoing recovery are:: return home   Choice offered to / list presented to : NA [place] ownership interest in [facility].provided to::  (NA)    Discharge Placement                       Discharge Plan and Services Additional resources added to the After Visit Summary for                  DME Arranged: N/A DME Agency: NA                  Social Drivers of Health (SDOH) Interventions SDOH Screenings   Food Insecurity: Food Insecurity Present (12/07/2023)  Housing: Low Risk  (12/07/2023)  Transportation Needs: No Transportation Needs (12/07/2023)  Utilities: Not At Risk (12/07/2023)  Social Connections: Unknown (12/07/2023)  Tobacco Use: High Risk (12/07/2023)     Readmission Risk Interventions     No data to display

## 2023-12-08 NOTE — Discharge Summary (Signed)
 Physician Discharge Summary   Patient: Renee Oliver MRN: [id number] DOB: May 07, 1956  Admit date:     12/07/2023  Discharge date: 12/08/23  Discharge Physician: Alberteen Sam   PCP: Genia Hotter, FNP     Recommendations at discharge:  Follow up with PCP Kae Heller in 1 week for influenza and COPD flare Hannah Stamey: Patient discharged on 2L home O2; please check ambulatory sats and continue or discontinue home oxygen as appropriate  Please repeat CXR in 4-6 weeks with nipple marker to document resolution Check CBC and BMP in 1 week (discharge Cr 0.5, K 3.4, Hgb 13.9)     Discharge Diagnoses: Principal Problem:   COPD with acute exacerbation (HCC) Other hospital problems:   Underweight   Postviral bronchitis   Nodular density on chest x-ray   Hyponatremia   Hypokalemia hypertension   Smoking   Anxiety   Hospital Course: 67 y.o. F with HTN, COPD, active smoking who presented with URI symptoms, nausea, anorexia, aches, fatigue, and malaise.  In the ER, she was flu positive, chest x-ray showed chronic interstitial coarsening, no pneumonia.  She was admitted on steroids, antibiotics, bronchodilators.     Acute COPD exacerbation with hypoxia due to recent influenza The patient felt at baseline.  She was treated with DuoNebs, steroid, antibiotics.  She ambulated on room air and desaturated, and given suspicion that she has chronic hypoxia that has been underrecognized at this point, she was discharged on home oxygen with PCP follow-up.     Poor appetite Patient was pretreated with ondansetron, and 8 without difficulty.  She has overall reduced appetite as result of influenza.  She has no evidence of renal failure, swallowing difficulty, liver disease. -Follow-up with PCP   Nodular density on chest x-ray -Repeat chest x-ray as an outpatient with nipple marker   Acute hyponatremia and hypokalemia Resolved with fluids and potassium supplement    Smoking Cessation recommended            The [facility] Controlled Substances Registry was reviewed for this patient prior to discharge.  Consultants: None Procedures performed: None Disposition: Home Diet recommendation:  Discharge Diet Orders (From admission, onward)     Start     Ordered   12/08/23 0000  Diet - low sodium heart healthy        12/08/23 1451              DISCHARGE MEDICATION: Allergies as of 12/08/2023       Reactions   Iodinated Contrast Media Anaphylaxis, Swelling, Rash, Other (See Comments)   Throat swelling   Amoxicillin Nausea And Vomiting, Other (See Comments)   Can take penicillin but Amoxicillin makes her have an upset stomach   Celexa [citalopram] Nausea Only, Other (See Comments)   Heartburn, too   Doxycycline Diarrhea, Nausea Only   Flexeril [cyclobenzaprine] Other (See Comments)   "Makes me feel very strange"/upset stomach   Lyrica [pregabalin] Nausea Only, Other (See Comments)   "Makes me feel strange/weird"   Magnesium Diarrhea, Nausea Only   Neurontin [gabapentin] Nausea Only, Other (See Comments)   "Makes me feel very strange/weird"   Nsaids Nausea And Vomiting   Prednisone Other (See Comments)   Hyperactivity and Mental status changes   Aspirin Nausea And Vomiting, Palpitations, Other (See Comments)   Heart palpations   Tramadol Nausea Only, Anxiety, Other (See Comments)   Insomnia/anorexia/mood changes/hyperactive (injection version per patient)        Medication List  TAKE these medications    acetaminophen 325 MG tablet Commonly known as: TYLENOL Take 325-650 mg by mouth every 6 (six) hours as needed (for pain.).   albuterol 108 (90 Base) MCG/ACT inhaler Commonly known as: VENTOLIN HFA Inhale 2 puffs into the lungs every 6 (six) hours as needed for wheezing or shortness of breath.   azithromycin 250 MG tablet Commonly known as: Zithromax Take 1 tablet (250 mg total) by mouth daily for 3 days.    cyclobenzaprine 10 MG tablet Commonly known as: FLEXERIL Take 10 mg by mouth 2 (two) times daily as needed for muscle spasms.   guaiFENesin 600 MG 12 hr tablet Commonly known as: MUCINEX Take 1 tablet (600 mg total) by mouth 2 (two) times daily.   hydrochlorothiazide 12.5 MG tablet Commonly known as: HYDRODIURIL Take 12.5 mg by mouth in the morning.   loperamide 2 MG tablet Commonly known as: IMODIUM A-D Take 2 mg by mouth 4 (four) times daily as needed for diarrhea or loose stools.   losartan 50 MG tablet Commonly known as: COZAAR Take 50 mg by mouth in the morning.   metoprolol succinate 50 MG 24 hr tablet Commonly known as: TOPROL-XL Take 50 mg by mouth in the morning. Take with or immediately following a meal.   naloxone 4 MG/0.1ML Liqd nasal spray kit Commonly known as: NARCAN Place 0.4 mg into the nose once as needed (for an accidental overdose).   ondansetron 4 MG tablet Commonly known as: ZOFRAN Take 1 tablet (4 mg total) by mouth every 6 (six) hours as needed for nausea.   oxyCODONE-acetaminophen 10-325 MG tablet Commonly known as: PERCOCET Take 1 tablet by mouth 5 (five) times daily.   pramipexole 0.25 MG tablet Commonly known as: MIRAPEX Take 0.25 mg by mouth at bedtime.   predniSONE 50 MG tablet Commonly known as: DELTASONE Take 1 tablet (50 mg total) by mouth daily with breakfast for 3 days.   sertraline 50 MG tablet Commonly known as: ZOLOFT Take 50 mg by mouth daily in the afternoon.   Symbicort 160-4.5 MCG/ACT inhaler Generic drug: budesonide-formoterol Inhale 2 puffs into the lungs 2 (two) times daily. What changed: when to take this   traZODone 100 MG tablet Commonly known as: DESYREL Take 100 mg by mouth at bedtime as needed for sleep.   Vitamin D (Ergocalciferol) 1.25 MG (50000 UNIT) Caps capsule Commonly known as: DRISDOL Take 50,000 Units by mouth every Sunday.               Durable Medical Equipment  (From admission,  onward)           Start     Ordered   12/08/23 1447  DME Oxygen  Once       Question Answer Comment  Length of Need 6 Months   Mode or (Route) Nasal cannula   Liters per Minute 2   Frequency Continuous (stationary and portable oxygen unit needed)   Oxygen delivery system Gas      03 /08/25 1451            Follow-up Information     Stamey, Verda Cumins, FNP. Schedule an appointment as soon as possible for a visit in 1 week(s).   Specialty: Family Medicine Contact information: [address] [place] [address] [phone]                 Discharge Instructions     Diet - low sodium heart healthy   Complete by:  As directed    Discharge instructions   Complete by: As directed    **IMPORTANT DISCHARGE INSTRUCTIONS**   From Dr. Maryfrances Bunnell: You were admitted for feeling achy and having a cough and having no appetite.  Here, we found that you had a flare of your COPD after the flu  This is common, that the flu will cause someone to have less appetite.  Your labs and imaging here were reassuring that there was nothing else obvious causing the nausea.  To reduce nausea, try the following:  1. Focus on fluids first 2. If you don't have much appetite, focus on high calorie foods first (nutrition supplements like Ensure, or other foods like milk shakes, ice cream, pimento cheese, grilled cheese, etc.)  3. Use ondansetron 4 mg (an anti-nausea medicine) before eating to see if that will help (limit to once or twice daily.  If you are still taking this in 2 weeks, call your doctor     For the COPD flare: Take prednisone 50 mg and azithromycin 250 mg once daily in the morning for 3 more days  You can use albuterol and mucinex and the flutter valve to clear the mucus out  Go see your primary doctor in 1 weeek  For now, use the oxygen at all times If you are sitting quietly, and your oxygen level is MORE THAN 88%, you can  take the oxygen off   If your  oxygen level is ever LESS THAN 88% and does not get better by itself, call your doctor to be seen immediately  Use a pulse oximeter to check your oxygen at home These are available at any store Your oxygen will drop when walking around, so always use the oxygen when walking around   Increase activity slowly   Complete by: As directed        Discharge Exam: Filed Weights   12/07/23 0915  Weight: 43.1 kg    General: Pt is alert, awake, not in acute distress Cardiovascular: RRR, nl S1-S2, no murmurs appreciated.   No LE edema.   Respiratory: Normal respiratory rate and rhythm.  CTAB without rales or wheezes. Abdominal: Abdomen soft and non-tender.  No distension or HSM.   Neuro/Psych: Strength symmetric in upper and lower extremities.  Judgment and insight appear normal.   Condition at discharge: fair  The results of significant diagnostics from this hospitalization (including imaging, microbiology, ancillary and laboratory) are listed below for reference.   Imaging Studies: DG Chest Port 1 View Result Date: 12/07/2023 CLINICAL DATA:  Shortness of breath. EXAM: PORTABLE CHEST 1 VIEW COMPARISON:  07/19/2021 FINDINGS: Hyperexpansion. Interstitial markings are diffusely coarsened with chronic features. No focal airspace consolidation, pulmonary edema, or substantial pleural effusion. Nodular density over the right base may reflect a nipple shadow. Multiple buttons project over the left lung. The cardiopericardial silhouette is within normal limits for size. No acute bony abnormality. Telemetry leads overlie the chest. IMPRESSION: 1. Hyperexpansion with chronic interstitial coarsening. No acute cardiopulmonary findings. 2. Nodular density over the right base may reflect a nipple shadow. Repeat PA and lateral chest x-ray with nipple markers recommended to confirm. Electronically Signed   By: Kennith Center M.D.   On: 12/07/2023 11:59   CT CHEST LCS NODULE F/U LOW DOSE WO CONTRAST Result Date:  11/26/2023 CLINICAL DATA:  68 year old female smoker presents for short-term follow-up EXAM: CT CHEST WITHOUT CONTRAST FOR LUNG CANCER SCREENING NODULE FOLLOW-UP TECHNIQUE: Multidetector CT imaging of the chest was performed following the standard protocol  without IV contrast. RADIATION DOSE REDUCTION: This exam was performed according to the departmental dose-optimization program which includes automated exposure control, adjustment of the mA and/or kV according to patient size and/or use of iterative reconstruction technique. COMPARISON:  07/25/2023 screening chest CT FINDINGS: Cardiovascular: Normal heart size. No significant pericardial effusion/thickening. Atherosclerotic nonaneurysmal thoracic aorta. Normal caliber pulmonary arteries. Mediastinum/Nodes: No significant thyroid nodules. Unremarkable esophagus. No pathologically enlarged axillary, mediastinal or hilar lymph nodes, noting limited sensitivity for the detection of hilar adenopathy on this noncontrast study. Calcified subcentimeter left hilar nodes are unchanged compatible with prior granulomatous disease. Lungs/Pleura: No pneumothorax. No pleural effusion. Moderate centrilobular emphysema with diffuse bronchial wall thickening. No acute consolidative airspace disease or lung masses. Patchy regions of mild cylindrical bronchiectasis, mucous impaction and tree-in-bud opacities throughout both lungs, most prominent in the inferior lingula and right middle lobe and posterior left lower lobe, similar. Previously described bilateral pulmonary nodules up to 7.9 mm in the subpleural posteromedial right upper lobe on series 302/image 46, all stable or decreased in size. No new significant pulmonary nodules. Upper abdomen: No acute abnormality. Musculoskeletal: No aggressive appearing focal osseous lesions. Mild thoracic spondylosis. IMPRESSION: 1. Lung-RADS 2, benign appearance or behavior. Continue annual screening with low-dose chest CT without contrast in  12 months. 2. 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 3. Aortic Atherosclerosis (ICD10-I70.0) and Emphysema (ICD10-J43.9). Electronically Signed   By: Delbert Phenix M.D.   On: 11/26/2023 11:58    Microbiology: Results for orders placed or performed during the hospital encounter of 12/07/23  Resp panel by RT-PCR (RSV, Flu A&B, Covid) Anterior Nasal Swab     Status: None   Collection Time: 12/07/23  9:22 AM   Specimen: Anterior Nasal Swab  Result Value Ref Range Status   SARS Coronavirus 2 by RT PCR NEGATIVE NEGATIVE Final    Comment: (NOTE) SARS-CoV-2 target nucleic acids are NOT DETECTED.  The SARS-CoV-2 RNA is generally detectable in upper respiratory specimens during the acute phase of infection. The lowest concentration of SARS-CoV-2 viral copies this assay can detect is 138 copies/mL. A negative result does not preclude SARS-Cov-2 infection and should not be used as the sole basis for treatment or other patient management decisions. A negative result may occur with  improper specimen collection/handling, submission of specimen other than nasopharyngeal swab, presence of viral mutation(s) within the areas targeted by this assay, and inadequate number of viral copies(<138 copies/mL). A negative result must be combined with clinical observations, patient history, and epidemiological information. The expected result is Negative.  Fact Sheet for Patients:  BloggerCourse.com  Fact Sheet for Healthcare Providers:  SeriousBroker.it  This test is no t yet approved or cleared by the [place] FDA and  has been authorized for detection and/or diagnosis of SARS-CoV-2 by FDA under an Emergency Use Authorization (EUA). This EUA will remain  in effect (meaning this test can be used) for  the duration of the COVID-19 declaration under Section 564(b)(1) of the Act, 21 U.S.C.section 360bbb-3(b)(1), unless the authorization is terminated  or revoked sooner.       Influenza A by PCR NEGATIVE NEGATIVE Final   Influenza B by PCR NEGATIVE NEGATIVE Final    Comment: (NOTE) The Xpert Xpress SARS-CoV-2/FLU/RSV plus assay is intended as an aid in the diagnosis of influenza from Nasopharyngeal swab specimens and should not be used as a sole basis for treatment. Nasal washings and  aspirates are unacceptable for Xpert Xpress SARS-CoV-2/FLU/RSV testing.  Fact Sheet for Patients: BloggerCourse.com  Fact Sheet for Healthcare Providers: SeriousBroker.it  This test is not yet approved or cleared by the [place] FDA and has been authorized for detection and/or diagnosis of SARS-CoV-2 by FDA under an Emergency Use Authorization (EUA). This EUA will remain in effect (meaning this test can be used) for the duration of the COVID-19 declaration under Section 564(b)(1) of the Act, 21 U.S.C. section 360bbb-3(b)(1), unless the authorization is terminated or revoked.     Resp Syncytial Virus by PCR NEGATIVE NEGATIVE Final    Comment: (NOTE) Fact Sheet for Patients: BloggerCourse.com  Fact Sheet for Healthcare Providers: SeriousBroker.it  This test is not yet approved or cleared by the [place] FDA and has been authorized for detection and/or diagnosis of SARS-CoV-2 by FDA under an Emergency Use Authorization (EUA). This EUA will remain in effect (meaning this test can be used) for the duration of the COVID-19 declaration under Section 564(b)(1) of the Act, 21 U.S.C. section 360bbb-3(b)(1), unless the authorization is terminated or revoked.  Performed at [facility], [address] [place], [place] [address]   Respiratory (~20 pathogens) panel by PCR      Status: Abnormal   Collection Time: 12/07/23  3:01 PM   Specimen: Nasopharyngeal Swab; Respiratory  Result Value Ref Range Status   Adenovirus NOT DETECTED NOT DETECTED Final   Coronavirus 229E NOT DETECTED NOT DETECTED Final    Comment: (NOTE) The Coronavirus on the Respiratory Panel, DOES NOT test for the novel  Coronavirus (2019 nCoV)    Coronavirus HKU1 NOT DETECTED NOT DETECTED Final   Coronavirus NL63 NOT DETECTED NOT DETECTED Final   Coronavirus OC43 NOT DETECTED NOT DETECTED Final   Metapneumovirus NOT DETECTED NOT DETECTED Final   Rhinovirus / Enterovirus NOT DETECTED NOT DETECTED Final   Influenza A H1 2009 DETECTED (A) NOT DETECTED Final   Influenza B NOT DETECTED NOT DETECTED Final   Parainfluenza Virus 1 NOT DETECTED NOT DETECTED Final   Parainfluenza Virus 2 NOT DETECTED NOT DETECTED Final   Parainfluenza Virus 3 NOT DETECTED NOT DETECTED Final   Parainfluenza Virus 4 NOT DETECTED NOT DETECTED Final   Respiratory Syncytial Virus NOT DETECTED NOT DETECTED Final   Bordetella pertussis NOT DETECTED NOT DETECTED Final   Bordetella Parapertussis NOT DETECTED NOT DETECTED Final   Chlamydophila pneumoniae NOT DETECTED NOT DETECTED Final   Mycoplasma pneumoniae NOT DETECTED NOT DETECTED Final    Comment: Performed at [facility], [address], [place], [place] [address]    Labs: CBC: Recent Labs  Lab 12/07/23 [phone] 12/07/23 1614 12/08/23 0656  WBC 10.8* 14.7* 7.8  HGB 16.9* 15.4* 13.9  HCT 49.8* 46.0 41.1  MCV 105.1* 106.5* 105.1*  PLT 504* 456* 409*   Basic Metabolic Panel: Recent Labs  Lab 12/07/23 0922 12/07/23 1614 12/08/23 0656  NA 132*  --  134*  K 3.0*  --  3.4*  CL 91*  --  97*  CO2 26  --  27  GLUCOSE 149*  --  110*  BUN 29*  --  18  CREATININE 0.80 0.51 0.51  CALCIUM 8.9  --  8.6*   Liver Function Tests: Recent Labs  Lab 12/07/23 0922  AST 25  ALT 26  ALKPHOS 135*  BILITOT 1.2  PROT 7.4  ALBUMIN 3.1*   CBG: No results for  input(s): "GLUCAP" in the last 168 hours.  Discharge time spent: approximately 45  minutes spent on discharge counseling, evaluation of patient on day of discharge, and coordination of discharge planning with nursing, social work, pharmacy and case management  Signed: Alberteen Sam, MD [facility] 12/08/2023

## 2023-12-11 ENCOUNTER — Other Ambulatory Visit (HOSPITAL_COMMUNITY): Payer: Self-pay

## 2023-12-12 DIAGNOSIS — Z79899 Other long term (current) drug therapy: Secondary | ICD-10-CM | POA: Diagnosis not present

## 2023-12-12 DIAGNOSIS — Z09 Encounter for follow-up examination after completed treatment for conditions other than malignant neoplasm: Secondary | ICD-10-CM | POA: Diagnosis not present

## 2023-12-12 DIAGNOSIS — J449 Chronic obstructive pulmonary disease, unspecified: Secondary | ICD-10-CM | POA: Diagnosis not present

## 2023-12-12 DIAGNOSIS — F17200 Nicotine dependence, unspecified, uncomplicated: Secondary | ICD-10-CM | POA: Diagnosis not present

## 2023-12-12 DIAGNOSIS — F17210 Nicotine dependence, cigarettes, uncomplicated: Secondary | ICD-10-CM | POA: Diagnosis not present

## 2023-12-12 DIAGNOSIS — Z9181 History of falling: Secondary | ICD-10-CM | POA: Diagnosis not present

## 2023-12-12 DIAGNOSIS — M961 Postlaminectomy syndrome, not elsewhere classified: Secondary | ICD-10-CM | POA: Diagnosis not present

## 2023-12-12 DIAGNOSIS — M542 Cervicalgia: Secondary | ICD-10-CM | POA: Diagnosis not present

## 2023-12-12 DIAGNOSIS — M47816 Spondylosis without myelopathy or radiculopathy, lumbar region: Secondary | ICD-10-CM | POA: Diagnosis not present

## 2023-12-12 DIAGNOSIS — Z681 Body mass index (BMI) 19 or less, adult: Secondary | ICD-10-CM | POA: Diagnosis not present

## 2023-12-17 DIAGNOSIS — J441 Chronic obstructive pulmonary disease with (acute) exacerbation: Secondary | ICD-10-CM | POA: Diagnosis not present

## 2023-12-17 DIAGNOSIS — Z79899 Other long term (current) drug therapy: Secondary | ICD-10-CM | POA: Diagnosis not present

## 2024-01-10 ENCOUNTER — Other Ambulatory Visit (HOSPITAL_BASED_OUTPATIENT_CLINIC_OR_DEPARTMENT_OTHER): Payer: Self-pay | Admitting: Family Medicine

## 2024-01-10 DIAGNOSIS — J441 Chronic obstructive pulmonary disease with (acute) exacerbation: Secondary | ICD-10-CM

## 2024-01-10 DIAGNOSIS — R0602 Shortness of breath: Secondary | ICD-10-CM | POA: Diagnosis not present

## 2024-01-11 DIAGNOSIS — Z9181 History of falling: Secondary | ICD-10-CM | POA: Diagnosis not present

## 2024-01-11 DIAGNOSIS — F17210 Nicotine dependence, cigarettes, uncomplicated: Secondary | ICD-10-CM | POA: Diagnosis not present

## 2024-01-11 DIAGNOSIS — F17200 Nicotine dependence, unspecified, uncomplicated: Secondary | ICD-10-CM | POA: Diagnosis not present

## 2024-01-11 DIAGNOSIS — I10 Essential (primary) hypertension: Secondary | ICD-10-CM | POA: Diagnosis not present

## 2024-01-11 DIAGNOSIS — M961 Postlaminectomy syndrome, not elsewhere classified: Secondary | ICD-10-CM | POA: Diagnosis not present

## 2024-01-11 DIAGNOSIS — Z79899 Other long term (current) drug therapy: Secondary | ICD-10-CM | POA: Diagnosis not present

## 2024-01-11 DIAGNOSIS — M542 Cervicalgia: Secondary | ICD-10-CM | POA: Diagnosis not present

## 2024-01-11 DIAGNOSIS — Z681 Body mass index (BMI) 19 or less, adult: Secondary | ICD-10-CM | POA: Diagnosis not present

## 2024-01-11 DIAGNOSIS — M47816 Spondylosis without myelopathy or radiculopathy, lumbar region: Secondary | ICD-10-CM | POA: Diagnosis not present

## 2024-01-16 DIAGNOSIS — Z79899 Other long term (current) drug therapy: Secondary | ICD-10-CM | POA: Diagnosis not present

## 2024-02-08 DIAGNOSIS — Z79899 Other long term (current) drug therapy: Secondary | ICD-10-CM | POA: Diagnosis not present

## 2024-02-08 DIAGNOSIS — R29818 Other symptoms and signs involving the nervous system: Secondary | ICD-10-CM | POA: Diagnosis not present

## 2024-02-08 DIAGNOSIS — M542 Cervicalgia: Secondary | ICD-10-CM | POA: Diagnosis not present

## 2024-02-08 DIAGNOSIS — I10 Essential (primary) hypertension: Secondary | ICD-10-CM | POA: Diagnosis not present

## 2024-02-08 DIAGNOSIS — M961 Postlaminectomy syndrome, not elsewhere classified: Secondary | ICD-10-CM | POA: Diagnosis not present

## 2024-02-08 DIAGNOSIS — J449 Chronic obstructive pulmonary disease, unspecified: Secondary | ICD-10-CM | POA: Diagnosis not present

## 2024-02-08 DIAGNOSIS — Z681 Body mass index (BMI) 19 or less, adult: Secondary | ICD-10-CM | POA: Diagnosis not present

## 2024-02-08 DIAGNOSIS — Z9181 History of falling: Secondary | ICD-10-CM | POA: Diagnosis not present

## 2024-02-08 DIAGNOSIS — M47816 Spondylosis without myelopathy or radiculopathy, lumbar region: Secondary | ICD-10-CM | POA: Diagnosis not present

## 2024-02-08 DIAGNOSIS — F17210 Nicotine dependence, cigarettes, uncomplicated: Secondary | ICD-10-CM | POA: Diagnosis not present

## 2024-03-07 DIAGNOSIS — Z9181 History of falling: Secondary | ICD-10-CM | POA: Diagnosis not present

## 2024-03-07 DIAGNOSIS — I10 Essential (primary) hypertension: Secondary | ICD-10-CM | POA: Diagnosis not present

## 2024-03-07 DIAGNOSIS — J449 Chronic obstructive pulmonary disease, unspecified: Secondary | ICD-10-CM | POA: Diagnosis not present

## 2024-03-07 DIAGNOSIS — F17210 Nicotine dependence, cigarettes, uncomplicated: Secondary | ICD-10-CM | POA: Diagnosis not present

## 2024-03-07 DIAGNOSIS — M961 Postlaminectomy syndrome, not elsewhere classified: Secondary | ICD-10-CM | POA: Diagnosis not present

## 2024-03-07 DIAGNOSIS — M47816 Spondylosis without myelopathy or radiculopathy, lumbar region: Secondary | ICD-10-CM | POA: Diagnosis not present

## 2024-03-07 DIAGNOSIS — Z681 Body mass index (BMI) 19 or less, adult: Secondary | ICD-10-CM | POA: Diagnosis not present

## 2024-03-07 DIAGNOSIS — Z79899 Other long term (current) drug therapy: Secondary | ICD-10-CM | POA: Diagnosis not present

## 2024-03-07 DIAGNOSIS — M542 Cervicalgia: Secondary | ICD-10-CM | POA: Diagnosis not present

## 2024-03-17 ENCOUNTER — Ambulatory Visit (HOSPITAL_BASED_OUTPATIENT_CLINIC_OR_DEPARTMENT_OTHER): Admitting: Pulmonary Disease

## 2024-03-17 ENCOUNTER — Encounter (HOSPITAL_BASED_OUTPATIENT_CLINIC_OR_DEPARTMENT_OTHER): Payer: Self-pay | Admitting: Pulmonary Disease

## 2024-03-17 VITALS — BP 108/64 | HR 83 | Ht 63.0 in | Wt 78.7 lb

## 2024-03-17 DIAGNOSIS — F17210 Nicotine dependence, cigarettes, uncomplicated: Secondary | ICD-10-CM

## 2024-03-17 DIAGNOSIS — J432 Centrilobular emphysema: Secondary | ICD-10-CM | POA: Diagnosis not present

## 2024-03-17 NOTE — Progress Notes (Signed)
 Subjective:    Patient ID: Renee Oliver, female    DOB: [birth year], 68 y.o.   MRN: [id number]  HPI  Renee Oliver is a 68 year old female with COPD who presents for a pulmonary consultation. She was referred by Alline Areas, her pain management specialist, for evaluation of her COPD.  COPD was diagnosed after a hospitalization in 2022 for septic pneumonia and COVID-19. She experiences persistent symptoms and difficulty regaining weight, currently weighing 78 pounds, down from 90 pounds a month ago, despite drinking Ensure.  She has shortness of breath and wheezing, particularly in the morning, resembling an anxiety attack. Symbicort  is used regularly and albuterol  as needed, two to three times a week, providing some relief. She has a persistent cough with tan to clear sputum production.  She smokes about a pack a day, sometimes less, and is attempting to cut back. Previous smoking cessation aids, including Chantix, Wellbutrin, and nicotine  patches, had limited success.  She was hospitalized for pneumonia in March 2025, receiving breathing treatments and prednisone , which she dislikes due to side effects such as feeling wired and unable to eat or sleep.  Significant tests/ events reviewed  LDCT chest 11/2023 >>Moderate centrilobular emphysema  Patchy regions of mild cylindrical bronchiectasis, mucous impaction and tree-in-bud opacities throughout both lungs, most prominent in the inferior lingula and right middle lobe and posterior left lower lobe, bilateral pulmonary nodules up to 7.9 mm in the subpleural posteromedial right upper lobe - stable  Past Medical History:  Diagnosis Date   Anxiety    Asthma    Chronic back pain    COPD (chronic obstructive pulmonary disease) (HCC)    COVID 07/2021   DDD (degenerative disc disease), lumbar    Depression    Hypertension    Pneumonia    septic pneumonia october 2022   Past Surgical History:  Procedure Laterality Date   ANTERIOR  CERVICAL DECOMP/DISCECTOMY FUSION N/A 02/14/2022   Procedure: Anterior Cervical Decompression Fusion  - C3-C4;  Surgeon: Agustina Aldrich, MD;  Location: [facility] OR;  Service: Neurosurgery;  Laterality: N/A;   BACK SURGERY     BILATERAL SALPINGECTOMY     CHOLECYSTECTOMY     DIAGNOSTIC LAPAROSCOPY     DILATATION & CURETTAGE/HYSTEROSCOPY WITH MYOSURE N/A 11/29/2016   Procedure: DILATATION & CURETTAGE/HYSTEROSCOPY;  Surgeon: Johnn Najjar, MD;  Location: [facility] ORS;  Service: Gynecology;  Laterality: N/A;  Ultrasound Guidance   PARTIAL HYSTERECTOMY     RIGHT OOPHORECTOMY     TONSILLECTOMY      Allergies  Allergen Reactions   Iodinated Contrast Media Anaphylaxis, Swelling, Rash and Other (See Comments)    Throat swelling   Amoxicillin Nausea And Vomiting and Other (See Comments)    Can take penicillin but Amoxicillin makes her have an upset stomach   Celexa [Citalopram] Nausea Only and Other (See Comments)    Heartburn, too   Doxycycline  Diarrhea and Nausea Only   Flexeril [Cyclobenzaprine] Other (See Comments)    Makes me feel very strange/upset stomach   Lyrica [Pregabalin] Nausea Only and Other (See Comments)    Makes me feel strange/weird   Magnesium  Diarrhea and Nausea Only   Neurontin [Gabapentin] Nausea Only and Other (See Comments)    Makes me feel very strange/weird   Nsaids Nausea And Vomiting   Prednisone  Other (See Comments)    Hyperactivity and Mental status changes   Aspirin Nausea And Vomiting, Palpitations and Other (See Comments)    Heart palpations    Tramadol  Nausea  Only, Anxiety and Other (See Comments)    Insomnia/anorexia/mood changes/hyperactive (injection version per patient)   Social History   Socioeconomic History   Marital status: Single    Spouse name: Not on file   Number of children: Not on file   Years of education: Not on file   Highest education level: Not on file  Occupational History   Not on file  Tobacco Use   Smoking status: Every Day     Current packs/day: 1.00    Average packs/day: 1 pack/day for 42.0 years (42.0 ttl pk-yrs)    Types: Cigarettes   Smokeless tobacco: Never  Vaping Use   Vaping status: Never Used  Substance and Sexual Activity   Alcohol use: Yes    Comment: 1-2 a week   Drug use: No   Sexual activity: Not on file  Other Topics Concern   Not on file  Social History Narrative   Not on file   Social Drivers of Health   Financial Resource Strain: Not on file  Food Insecurity: Food Insecurity Present (12/07/2023)   Hunger Vital Sign    Worried About Running Out of Food in the Last Year: Sometimes true    Ran Out of Food in the Last Year: Never true  Transportation Needs: No Transportation Needs (12/07/2023)   PRAPARE - Administrator, Civil Service (Medical): No    Lack of Transportation (Non-Medical): No  Physical Activity: Not on file  Stress: Not on file  Social Connections: Unknown (12/07/2023)   Social Connection and Isolation Panel    Frequency of Communication with Friends and Family: Twice a week    Frequency of Social Gatherings with Friends and Family: Once a week    Attends Religious Services: 1 to 4 times per year    Active Member of [employer] or Organizations: No    Attends Banker Meetings: 1 to 4 times per year    Marital Status: Patient declined  Intimate Partner Violence: Not At Risk (12/07/2023)   Humiliation, Afraid, Rape, and Kick questionnaire    Fear of Current or Ex-Partner: No    Emotionally Abused: No    Physically Abused: No    Sexually Abused: No    History reviewed. No pertinent family history.    Review of Systems  + weight loss Constitutional: negative for anorexia, fevers and sweats  Eyes: negative for irritation, redness and visual disturbance  Ears, nose, mouth, throat, and face: negative for earaches, epistaxis, nasal congestion and sore throat  Respiratory: positive for cough, dyspnea on exertion, sputum and wheezing  Cardiovascular:  negative for chest pain, dyspnea, lower extremity edema, orthopnea, palpitations and syncope  Gastrointestinal: negative for abdominal pain, constipation, diarrhea, melena, nausea and vomiting  Genitourinary:negative for dysuria, frequency and hematuria  Hematologic/lymphatic: negative for bleeding, easy bruising and lymphadenopathy  Musculoskeletal:negative for arthralgias, muscle weakness and stiff joints  Neurological: negative for coordination problems, gait problems, headaches and weakness  Endocrine: negative for diabetic symptoms including polydipsia, polyuria and weight loss     Objective:   Physical Exam  Gen. Pleasant, thin, frail, in no distress, normal affect ENT - no pallor,icterus, no post nasal drip Neck: No JVD, no thyromegaly, no carotid bruits Lungs: no use of accessory muscles, no dullness to percussion, decreased BL without rales or rhonchi  Cardiovascular: Rhythm regular, heart sounds  normal, no murmurs or gallops, no peripheral edema Abdomen: soft and non-tender, no hepatosplenomegaly, BS normal. Musculoskeletal: No deformities, no cyanosis or clubbing Neuro:  alert, non focal       Assessment & Plan:   Moderate emphysema Moderate emphysema confirmed on lung scan with visible holes in lung tissue. Symptoms include dyspnea, wheezing, and productive cough with tan to clear sputum, worse in the morning. Smoking contributes to the condition. Previous hospitalization for pneumonia and influenza. Current treatment includes Symbicort  and albuterol , providing some relief. Discussed emphysema pathophysiology and smoking cessation. Explained need for PFT to assess lung function and emphysema extent. Discussed potential switch to Breztri pending PFT results and insurance coverage. - Order PFT to assess lung function and emphysema extent - Consider switching from Symbicort  to Breztri after PFT results, depending on insurance coverage - Educate on smoking cessation and  recommend trial of 21 mg nicotine  patch  Tobacco abuse Chronic tobacco use, smoking approximately one pack per day. Attempts to reduce smoking have been unsuccessful. Previous trials of varenicline and bupropion were ineffective. Discussed smoking's impact on emphysema and health. Emphasized smoking cessation for lung health and well-being. Recommended nicotine  patch as an aid. - Recommend trial of 21 mg nicotine  patch to aid in smoking cessation - Encourage setting a quit date and reducing cigarette consumption to five per day before quitting  Severe protein-calorie malnutrition Severe protein-calorie malnutrition with significant weight loss. Weight fluctuates between 78 and 90 pounds. Consuming Ensure to gain weight but reports no improvement. Refused colonoscopy for further evaluation. - Continue protein shakes to support nutritional intake

## 2024-03-17 NOTE — Patient Instructions (Signed)
 VISIT SUMMARY:  You came in today for a pulmonary consultation due to your COPD. We discussed your symptoms, including shortness of breath, wheezing, and weight loss. We also reviewed your current medications and smoking habits.  YOUR PLAN:  -MODERATE EMPHYSEMA: Moderate emphysema means there is damage to the air sacs in your lungs, making it hard to breathe. We will order a Pulmonary Function Test (PFT) to assess your lung function and the extent of the emphysema. Depending on the results and your insurance coverage, we may switch your medication from Symbicort  to Breztri. We also discussed the importance of quitting smoking to help manage your symptoms and recommended trying a 21 mg nicotine  patch.  -TOBACCO ABUSE: Tobacco abuse refers to the chronic use of tobacco, which is harmful to your lungs and overall health. We talked about how smoking affects your emphysema and overall well-being. We recommend trying a 21 mg nicotine  patch to help you quit smoking. It's important to set a quit date and try to reduce your cigarette consumption to five per day before quitting completely.  -SEVERE PROTEIN-CALORIE MALNUTRITION: Severe protein-calorie malnutrition means your body is not getting enough nutrients, leading to significant weight loss. You should continue drinking protein shakes like Ensure to support your nutritional intake.  INSTRUCTIONS:  Please schedule a Pulmonary Function Test (PFT) to assess your lung function and the extent of your emphysema. Consider switching to Breztri after we review the PFT results and check your insurance coverage. Try using a 21 mg nicotine  patch to help quit smoking, and aim to reduce your cigarette consumption to five per day before quitting completely. Continue drinking protein shakes to help with your nutritional intake.

## 2024-04-08 DIAGNOSIS — M542 Cervicalgia: Secondary | ICD-10-CM | POA: Diagnosis not present

## 2024-04-08 DIAGNOSIS — Z9181 History of falling: Secondary | ICD-10-CM | POA: Diagnosis not present

## 2024-04-08 DIAGNOSIS — J449 Chronic obstructive pulmonary disease, unspecified: Secondary | ICD-10-CM | POA: Diagnosis not present

## 2024-04-08 DIAGNOSIS — I10 Essential (primary) hypertension: Secondary | ICD-10-CM | POA: Diagnosis not present

## 2024-04-08 DIAGNOSIS — Z79899 Other long term (current) drug therapy: Secondary | ICD-10-CM | POA: Diagnosis not present

## 2024-04-08 DIAGNOSIS — Z681 Body mass index (BMI) 19 or less, adult: Secondary | ICD-10-CM | POA: Diagnosis not present

## 2024-04-08 DIAGNOSIS — M961 Postlaminectomy syndrome, not elsewhere classified: Secondary | ICD-10-CM | POA: Diagnosis not present

## 2024-04-08 DIAGNOSIS — M47816 Spondylosis without myelopathy or radiculopathy, lumbar region: Secondary | ICD-10-CM | POA: Diagnosis not present

## 2024-05-07 DIAGNOSIS — F17210 Nicotine dependence, cigarettes, uncomplicated: Secondary | ICD-10-CM | POA: Diagnosis not present

## 2024-05-07 DIAGNOSIS — Z9181 History of falling: Secondary | ICD-10-CM | POA: Diagnosis not present

## 2024-05-07 DIAGNOSIS — M542 Cervicalgia: Secondary | ICD-10-CM | POA: Diagnosis not present

## 2024-05-07 DIAGNOSIS — Z681 Body mass index (BMI) 19 or less, adult: Secondary | ICD-10-CM | POA: Diagnosis not present

## 2024-05-07 DIAGNOSIS — M961 Postlaminectomy syndrome, not elsewhere classified: Secondary | ICD-10-CM | POA: Diagnosis not present

## 2024-05-07 DIAGNOSIS — Z79899 Other long term (current) drug therapy: Secondary | ICD-10-CM | POA: Diagnosis not present

## 2024-05-07 DIAGNOSIS — I10 Essential (primary) hypertension: Secondary | ICD-10-CM | POA: Diagnosis not present

## 2024-05-07 DIAGNOSIS — M47816 Spondylosis without myelopathy or radiculopathy, lumbar region: Secondary | ICD-10-CM | POA: Diagnosis not present

## 2024-05-13 DIAGNOSIS — Z79899 Other long term (current) drug therapy: Secondary | ICD-10-CM | POA: Diagnosis not present

## 2024-06-05 DIAGNOSIS — J449 Chronic obstructive pulmonary disease, unspecified: Secondary | ICD-10-CM | POA: Diagnosis not present

## 2024-06-05 DIAGNOSIS — Z9181 History of falling: Secondary | ICD-10-CM | POA: Diagnosis not present

## 2024-06-05 DIAGNOSIS — M542 Cervicalgia: Secondary | ICD-10-CM | POA: Diagnosis not present

## 2024-06-05 DIAGNOSIS — I10 Essential (primary) hypertension: Secondary | ICD-10-CM | POA: Diagnosis not present

## 2024-06-05 DIAGNOSIS — M47816 Spondylosis without myelopathy or radiculopathy, lumbar region: Secondary | ICD-10-CM | POA: Diagnosis not present

## 2024-06-05 DIAGNOSIS — Z79899 Other long term (current) drug therapy: Secondary | ICD-10-CM | POA: Diagnosis not present

## 2024-06-05 DIAGNOSIS — M961 Postlaminectomy syndrome, not elsewhere classified: Secondary | ICD-10-CM | POA: Diagnosis not present

## 2024-06-05 DIAGNOSIS — Z681 Body mass index (BMI) 19 or less, adult: Secondary | ICD-10-CM | POA: Diagnosis not present

## 2024-06-11 ENCOUNTER — Other Ambulatory Visit: Payer: Self-pay

## 2024-06-16 ENCOUNTER — Encounter

## 2024-06-16 ENCOUNTER — Ambulatory Visit: Admitting: Adult Health

## 2024-07-04 DIAGNOSIS — M47816 Spondylosis without myelopathy or radiculopathy, lumbar region: Secondary | ICD-10-CM | POA: Diagnosis not present

## 2024-07-04 DIAGNOSIS — R0602 Shortness of breath: Secondary | ICD-10-CM | POA: Diagnosis not present

## 2024-07-04 DIAGNOSIS — I959 Hypotension, unspecified: Secondary | ICD-10-CM | POA: Diagnosis not present

## 2024-07-04 DIAGNOSIS — M961 Postlaminectomy syndrome, not elsewhere classified: Secondary | ICD-10-CM | POA: Diagnosis not present

## 2024-07-04 DIAGNOSIS — Z9181 History of falling: Secondary | ICD-10-CM | POA: Diagnosis not present

## 2024-07-04 DIAGNOSIS — Z79899 Other long term (current) drug therapy: Secondary | ICD-10-CM | POA: Diagnosis not present

## 2024-07-04 DIAGNOSIS — Z681 Body mass index (BMI) 19 or less, adult: Secondary | ICD-10-CM | POA: Diagnosis not present

## 2024-07-04 DIAGNOSIS — M542 Cervicalgia: Secondary | ICD-10-CM | POA: Diagnosis not present

## 2024-07-04 DIAGNOSIS — J449 Chronic obstructive pulmonary disease, unspecified: Secondary | ICD-10-CM | POA: Diagnosis not present

## 2024-07-04 DIAGNOSIS — F17210 Nicotine dependence, cigarettes, uncomplicated: Secondary | ICD-10-CM | POA: Diagnosis not present

## 2024-08-01 DIAGNOSIS — F17210 Nicotine dependence, cigarettes, uncomplicated: Secondary | ICD-10-CM | POA: Diagnosis not present

## 2024-08-01 DIAGNOSIS — Z681 Body mass index (BMI) 19 or less, adult: Secondary | ICD-10-CM | POA: Diagnosis not present

## 2024-08-01 DIAGNOSIS — M47816 Spondylosis without myelopathy or radiculopathy, lumbar region: Secondary | ICD-10-CM | POA: Diagnosis not present

## 2024-08-01 DIAGNOSIS — Z79899 Other long term (current) drug therapy: Secondary | ICD-10-CM | POA: Diagnosis not present

## 2024-08-01 DIAGNOSIS — M961 Postlaminectomy syndrome, not elsewhere classified: Secondary | ICD-10-CM | POA: Diagnosis not present

## 2024-08-01 DIAGNOSIS — M542 Cervicalgia: Secondary | ICD-10-CM | POA: Diagnosis not present

## 2024-08-01 DIAGNOSIS — Z9181 History of falling: Secondary | ICD-10-CM | POA: Diagnosis not present
